# Patient Record
Sex: Male | Born: 1990 | Race: Black or African American | Hispanic: No | Marital: Single | Smoking: Current every day smoker
Health system: Southern US, Community
[De-identification: ages and names within clinical notes are randomized; demographics above are authoritative.]

---

## 2012-09-02 ENCOUNTER — Encounter (HOSPITAL_COMMUNITY): Payer: Self-pay | Admitting: Emergency Medicine

## 2012-09-02 ENCOUNTER — Emergency Department (HOSPITAL_COMMUNITY)
Admission: EM | Admit: 2012-09-02 | Discharge: 2012-09-03 | Disposition: A | Discharge status: Home / Self Care | Payer: Medicaid Other | Attending: Emergency Medicine | Admitting: Emergency Medicine

## 2012-09-02 DIAGNOSIS — N509 Disorder of male genital organs, unspecified: Secondary | ICD-10-CM | POA: Insufficient documentation

## 2012-09-02 DIAGNOSIS — R369 Urethral discharge, unspecified: Secondary | ICD-10-CM | POA: Insufficient documentation

## 2012-09-02 DIAGNOSIS — F17200 Nicotine dependence, unspecified, uncomplicated: Secondary | ICD-10-CM | POA: Insufficient documentation

## 2012-09-02 DIAGNOSIS — R300 Dysuria: Secondary | ICD-10-CM | POA: Insufficient documentation

## 2012-09-02 LAB — URINALYSIS, ROUTINE W REFLEX MICROSCOPIC
Bilirubin Urine: NEGATIVE
Glucose, UA: NEGATIVE mg/dL
Ketones, ur: NEGATIVE mg/dL
Nitrite: NEGATIVE
Protein, ur: 30 mg/dL — AB
Specific Gravity, Urine: 1.025 (ref 1.005–1.030)
Urobilinogen, UA: 1 mg/dL (ref 0.0–1.0)
pH: 6 (ref 5.0–8.0)

## 2012-09-02 LAB — URINE MICROSCOPIC-ADD ON

## 2012-09-02 MED ORDER — CEFTRIAXONE SODIUM 250 MG IJ SOLR
250.0000 mg | Freq: Once | INTRAMUSCULAR | Status: AC
  Administered 2012-09-03: 250 mg via INTRAMUSCULAR
  Filled 2012-09-02: qty 250

## 2012-09-02 MED ORDER — AZITHROMYCIN 250 MG PO TABS
1000.0000 mg | ORAL_TABLET | Freq: Once | ORAL | Status: AC
  Administered 2012-09-03: 1000 mg via ORAL
  Filled 2012-09-02: qty 4

## 2012-09-02 NOTE — ED Provider Notes (Signed)
History  This chart was scribed for non-physician practitioner, Felipa Furnace, working with Gavin Pound. Oletta Lamas, MD by Ardeen Jourdain, ED Scribe. This patient was seen in room TR07C/TR07C and the patient's care was started at 2312.  CSN: [id number]     Arrival date & time 09/02/12  2245   First MD Initiated Contact with Patient 09/02/12 2312     Chief Complaint  Patient presents with  . Penile Discharge    The history is provided by the patient. No language interpreter was used.    HPI Comments: Julian Reynolds is a 22 y.o. male who presents to the Emergency Department complaining of gradual onset, gradually worsening, constant penile discharge, testicular pain and dysuria that began 1 day ago. Pt states he had unprotected sex several days ago. He describes the discharge as yellow/green in color. He denies any fever, chills, nausea, emesis and penile swelling as associated symptoms.    History reviewed. No pertinent past medical history.  History reviewed. No pertinent past surgical history.  No family history on file.  History  Substance Use Topics  . Smoking status: Current Every Day Smoker  . Smokeless tobacco: Not on file  . Alcohol Use: Yes    Review of Systems  Genitourinary: Positive for dysuria and discharge.  All other systems reviewed and are negative.    Allergies  Review of patient's allergies indicates no known allergies.  Home Medications   Current Outpatient Rx  Name  Route  Sig  Dispense  Refill  . acetaminophen (TYLENOL) 325 MG tablet   Oral   Take 325-650 mg by mouth every 6 (six) hours as needed for pain (headache).           Triage Vitals: BP 148/92  Pulse 102  Temp(Src) 99 F (37.2 C) (Oral)  Resp 14  SpO2 98%  Physical Exam  Nursing note and vitals reviewed. Constitutional: He is oriented to person, place, and time. He appears well-developed and well-nourished. No distress.  HENT:  Head: Normocephalic and atraumatic.  Eyes: EOM  are normal.  Neck: Neck supple. No tracheal deviation present.  Cardiovascular: Normal rate.   Pulmonary/Chest: Effort normal. No respiratory distress.  Genitourinary:  Mild yellow discharge from penis. Circumcised male. No masses, bumps or lesions of the shaft or testes. No TTP  Musculoskeletal: Normal range of motion.  Neurological: He is alert and oriented to person, place, and time.  Skin: Skin is warm and dry.  Psychiatric: He has a normal mood and affect. His behavior is normal.    ED Course   Procedures (including critical care time)  DIAGNOSTIC STUDIES: Oxygen Saturation is 98% on room air, normal by my interpretation.    COORDINATION OF CARE:  11:34 PM-Discussed treatment plan which includes UA, urine microscopic, urine culture, GC/Chlymadia swab and antibiotics with pt at bedside and pt agreed to plan.    Labs Reviewed  URINALYSIS, ROUTINE W REFLEX MICROSCOPIC - Abnormal; Notable for the following:    APPearance CLOUDY (*)    Hgb urine dipstick SMALL (*)    Protein, ur 30 (*)    Leukocytes, UA LARGE (*)    All other components within normal limits  URINE MICROSCOPIC-ADD ON - Abnormal; Notable for the following:    Bacteria, UA MANY (*)    All other components within normal limits  URINE CULTURE   No results found. 1. Penile discharge     MDM  Patient with penile discharge. Will treat with azithromycin and ceftriaxone. Patient is stable  and ready for discharge.   I personally performed the services described in this documentation, which was scribed in my presence. The recorded information has been reviewed and is accurate.     Roxy Horseman, PA-C 09/02/12 2343

## 2012-09-02 NOTE — ED Notes (Signed)
PT. REPORTS PENILE DISCHARGE / DYSURIA ONSET YESTERDAY , PT. STATED UNPROTECTED SEXUAL ENCOUNTER SEVERAL DAYS AGO .

## 2012-09-03 NOTE — ED Notes (Signed)
No changes from triage assessment 

## 2012-09-04 LAB — URINE CULTURE: Colony Count: 25000

## 2012-09-04 NOTE — ED Provider Notes (Signed)
Medical screening examination/treatment/procedure(s) were performed by non-physician practitioner and as supervising physician I was immediately available for consultation/collaboration.   Gavin Pound. Oletta Lamas, MD 09/04/12 [phone]

## 2012-09-05 ENCOUNTER — Telehealth (HOSPITAL_COMMUNITY): Payer: Self-pay | Admitting: Emergency Medicine

## 2012-09-05 LAB — GC/CHLAMYDIA PROBE AMP
CT Probe RNA: NEGATIVE
GC Probe RNA: POSITIVE — AB

## 2012-09-05 NOTE — ED Notes (Signed)
+  Gonorrhea. Patient treated with Rocephin and Zithromax. [employer] faxed. Patient called for test results and was informed of +result and appropriate treatment. Was advised to abstain from sex for 2 weeks and notify partner(s) so that they could be treated.

## 2014-06-29 ENCOUNTER — Emergency Department (HOSPITAL_COMMUNITY)
Admission: EM | Admit: 2014-06-29 | Discharge: 2014-06-29 | Disposition: A | Discharge status: Home / Self Care | Payer: Medicaid Other | Attending: Emergency Medicine | Admitting: Emergency Medicine

## 2014-06-29 ENCOUNTER — Encounter (HOSPITAL_COMMUNITY): Payer: Self-pay | Admitting: Emergency Medicine

## 2014-06-29 DIAGNOSIS — J209 Acute bronchitis, unspecified: Secondary | ICD-10-CM

## 2014-06-29 DIAGNOSIS — R079 Chest pain, unspecified: Secondary | ICD-10-CM | POA: Diagnosis present

## 2014-06-29 DIAGNOSIS — Z720 Tobacco use: Secondary | ICD-10-CM | POA: Diagnosis not present

## 2014-06-29 MED ORDER — AZITHROMYCIN 250 MG PO TABS
500.0000 mg | ORAL_TABLET | Freq: Once | ORAL | Status: AC
  Administered 2014-06-29: 500 mg via ORAL
  Filled 2014-06-29: qty 2

## 2014-06-29 MED ORDER — ALBUTEROL SULFATE HFA 108 (90 BASE) MCG/ACT IN AERS
2.0000 | INHALATION_SPRAY | Freq: Once | RESPIRATORY_TRACT | Status: AC
  Administered 2014-06-29: 2 via RESPIRATORY_TRACT
  Filled 2014-06-29: qty 6.7

## 2014-06-29 MED ORDER — OXYCODONE-ACETAMINOPHEN 5-325 MG PO TABS
2.0000 | ORAL_TABLET | Freq: Once | ORAL | Status: AC
  Administered 2014-06-29: 2 via ORAL
  Filled 2014-06-29: qty 2

## 2014-06-29 MED ORDER — OXYCODONE-ACETAMINOPHEN 5-325 MG PO TABS: 1.0000 | ORAL_TABLET | ORAL | Status: DC | PRN

## 2014-06-29 MED ORDER — AZITHROMYCIN 250 MG PO TABS: ORAL_TABLET | ORAL | Status: DC

## 2014-06-29 MED ORDER — AEROCHAMBER Z-STAT PLUS/MEDIUM MISC
1.0000 | Freq: Once | Status: AC
  Administered 2014-06-29: 1
  Filled 2014-06-29: qty 1

## 2014-06-29 NOTE — ED Provider Notes (Signed)
CSN: [id number]     Arrival date & time 06/29/14  1306 History   First MD Initiated Contact with Patient 06/29/14 1341     Chief Complaint  Patient presents with  . Chest Pain     (Consider location/radiation/quality/duration/timing/severity/associated sxs/prior Treatment) HPI  Julian Reynolds is a 24 y.o. male who presents for evaluation of cough, chest discomfort, sputum production, ear pain and sore throat for 3 weeks. He smokes cigarettes. He denies weakness, dizziness, nausea or vomiting. There are no other known modifying factors.    History reviewed. No pertinent past medical history. History reviewed. No pertinent past surgical history. No family history on file. History  Substance Use Topics  . Smoking status: Current Every Day Smoker  . Smokeless tobacco: Not on file  . Alcohol Use: Yes    Review of Systems  All other systems reviewed and are negative.     Allergies  Review of patient's allergies indicates no known allergies.  Home Medications   Prior to Admission medications   Medication Sig Start Date End Date Taking? Authorizing Provider  acetaminophen (TYLENOL) 325 MG tablet Take 650 mg by mouth every 6 (six) hours as needed for moderate pain (headache).    Yes Historical Provider, MD   BP 141/96 mmHg  Pulse 82  Temp(Src) 98.3 F (36.8 C) (Oral)  Resp 20  SpO2 97% Physical Exam  Constitutional: He is oriented to person, place, and time. He appears well-developed and well-nourished.  HENT:  Head: Normocephalic and atraumatic.  Right Ear: External ear normal.  Left Ear: External ear normal.  Eyes: Conjunctivae and EOM are normal. Pupils are equal, round, and reactive to light.  Neck: Normal range of motion and phonation normal. Neck supple.  Cardiovascular: Normal rate, regular rhythm and normal heart sounds.   Pulmonary/Chest: Effort normal. He exhibits no bony tenderness.  Few scattered rhonchi. Mildly decreased airflow bilaterally. No audible  wheezes.  Abdominal: Soft. There is no tenderness.  Musculoskeletal: Normal range of motion.  Neurological: He is alert and oriented to person, place, and time. No cranial nerve deficit or sensory deficit. He exhibits normal muscle tone. Coordination normal.  Skin: Skin is warm, dry and intact.  Psychiatric: He has a normal mood and affect. His behavior is normal. Judgment and thought content normal.  Nursing note and vitals reviewed.   ED Course  Procedures (including critical care time) Medications  oxyCODONE-acetaminophen (PERCOCET/ROXICET) 5-325 MG per tablet 2 tablet (not administered)  azithromycin (ZITHROMAX) tablet 500 mg (not administered)  albuterol (PROVENTIL HFA;VENTOLIN HFA) 108 (90 BASE) MCG/ACT inhaler 2 puff (not administered)  aerochamber Z-Stat Plus/medium 1 each (not administered)    Patient Vitals for the past 24 hrs:  BP Temp Temp src Pulse Resp SpO2  06/29/14 1320 141/96 mmHg 98.3 F (36.8 C) Oral 82 20 97 %    3:45 PM Reevaluation with update and discussion. After initial assessment and treatment, an updated evaluation reveals he is more comfortable. [place]     Labs Review Labs Reviewed - No data to display  Imaging Review No results found.   EKG Interpretation   Date/Time:  Saturday Jun 29 2014 13:22:07 EDT Ventricular Rate:  76 PR Interval:  129 QRS Duration: 91 QT Interval:  356 QTC Calculation: 400 R Axis:   51 Text Interpretation:  Sinus rhythm Borderline ST elevation, anterior leads  No previous ECGs available Confirmed by Effie Shy  MD, Kairy Folsom ([id number]) on  06/29/2014 1:53:26 PM      MDM   Final  diagnoses:  Acute bronchitis, unspecified organism  Tobacco abuse    Evaluation consistent with bronchitis related to tobacco abuse. Doubt pneumonia, ACS or seizures bacterial infection  Nursing Notes Reviewed/ Care Coordinated Applicable Imaging Reviewed Interpretation of Laboratory Data incorporated into ED treatment  The  patient appears reasonably screened and/or stabilized for discharge and I doubt any other medical condition or other [facility] requiring further screening, evaluation, or treatment in the ED at this time prior to discharge.  Plan: Home Medications- oral ABX, Prednisone, inhaler; Home Treatments- stop smoking; return here if the recommended treatment, does not improve the symptoms; Recommended follow up- PCP prn    Mancel BaleElliott Brendin Situ, MD 07/02/14 1531

## 2014-06-29 NOTE — Discharge Instructions (Signed)
Use the inhaler 2 puffs every 4 hours for cough or trouble breathing. Get plenty of rest, and drink a lot of fluids.    Acute Bronchitis Bronchitis is inflammation of the airways that extend from the windpipe into the lungs (bronchi). The inflammation often causes mucus to develop. This leads to a cough, which is the most common symptom of bronchitis.  In acute bronchitis, the condition usually develops suddenly and goes away over time, usually in a couple weeks. Smoking, allergies, and asthma can make bronchitis worse. Repeated episodes of bronchitis may cause further lung problems.  CAUSES Acute bronchitis is most often caused by the same virus that causes a cold. The virus can spread from person to person (contagious) through coughing, sneezing, and touching contaminated objects. SIGNS AND SYMPTOMS   Cough.   Fever.   Coughing up mucus.   Body aches.   Chest congestion.   Chills.   Shortness of breath.   Sore throat.  DIAGNOSIS  Acute bronchitis is usually diagnosed through a physical exam. Your health care provider will also ask you questions about your medical history. Tests, such as chest X-rays, are sometimes done to rule out other conditions.  TREATMENT  Acute bronchitis usually goes away in a couple weeks. Oftentimes, no medical treatment is necessary. Medicines are sometimes given for relief of fever or cough. Antibiotic medicines are usually not needed but may be prescribed in certain situations. In some cases, an inhaler may be recommended to help reduce shortness of breath and control the cough. A cool mist vaporizer may also be used to help thin bronchial secretions and make it easier to clear the chest.  HOME CARE INSTRUCTIONS  Get plenty of rest.   Drink enough fluids to keep your urine clear or pale yellow (unless you have a medical condition that requires fluid restriction). Increasing fluids may help thin your respiratory secretions (sputum) and reduce  chest congestion, and it will prevent dehydration.   Take medicines only as directed by your health care provider.  If you were prescribed an antibiotic medicine, finish it all even if you start to feel better.  Avoid smoking and secondhand smoke. Exposure to cigarette smoke or irritating chemicals will make bronchitis worse. If you are a smoker, consider using nicotine gum or skin patches to help control withdrawal symptoms. Quitting smoking will help your lungs heal faster.   Reduce the chances of another bout of acute bronchitis by washing your hands frequently, avoiding people with cold symptoms, and trying not to touch your hands to your mouth, nose, or eyes.   Keep all follow-up visits as directed by your health care provider.  SEEK MEDICAL CARE IF: Your symptoms do not improve after 1 week of treatment.  SEEK IMMEDIATE MEDICAL CARE IF:  You develop an increased fever or chills.   You have chest pain.   You have severe shortness of breath.  You have bloody sputum.   You develop dehydration.  You faint or repeatedly feel like you are going to pass out.  You develop repeated vomiting.  You develop a severe headache. MAKE SURE YOU:   Understand these instructions.  Will watch your condition.  Will get help right away if you are not doing well or get worse. Document Released: 03/04/2004 Document Revised: 06/11/2013 Document Reviewed: 07/18/2012 [facility] Patient Information 2015 [employer] [place]. This information is not intended to replace advice given to you by your health care provider. Make sure you discuss any questions you have with your health  care provider.  

## 2014-06-29 NOTE — ED Notes (Signed)
Patient with productive cough and chest pain for three weeks.  Pain is in the upper chest area with no radiation.  He also reports bilateral ear pain and a sore throat.  Patient is unsure of any prior medical history and reports he has not seen a doctor in a long time.

## 2014-12-11 ENCOUNTER — Emergency Department (HOSPITAL_COMMUNITY): Payer: Medicaid Other

## 2014-12-11 ENCOUNTER — Emergency Department (HOSPITAL_COMMUNITY)
Admission: EM | Admit: 2014-12-11 | Discharge: 2014-12-11 | Disposition: A | Discharge status: Home / Self Care | Payer: Medicaid Other | Source: Home / Self Care | Attending: Emergency Medicine | Admitting: Emergency Medicine

## 2014-12-11 ENCOUNTER — Encounter (HOSPITAL_COMMUNITY): Payer: Self-pay | Admitting: Emergency Medicine

## 2014-12-11 DIAGNOSIS — Z202 Contact with and (suspected) exposure to infections with a predominantly sexual mode of transmission: Secondary | ICD-10-CM

## 2014-12-11 DIAGNOSIS — B349 Viral infection, unspecified: Secondary | ICD-10-CM

## 2014-12-11 LAB — COMPREHENSIVE METABOLIC PANEL
ALT: 98 U/L — ABNORMAL HIGH (ref 17–63)
AST: 77 U/L — AB (ref 15–41)
Albumin: 4.5 g/dL (ref 3.5–5.0)
Alkaline Phosphatase: 50 U/L (ref 38–126)
Anion gap: 8 (ref 5–15)
BILIRUBIN TOTAL: 0.9 mg/dL (ref 0.3–1.2)
BUN: 8 mg/dL (ref 6–20)
CO2: 22 mmol/L (ref 22–32)
Calcium: 9.5 mg/dL (ref 8.9–10.3)
Chloride: 106 mmol/L (ref 101–111)
Creatinine, Ser: 1.34 mg/dL — ABNORMAL HIGH (ref 0.61–1.24)
GFR calc Af Amer: >60 mL/min (ref >60)
Glucose, Bld: 116 mg/dL — ABNORMAL HIGH (ref 65–99)
POTASSIUM: 4.4 mmol/L (ref 3.5–5.1)
Sodium: 136 mmol/L (ref 135–145)
TOTAL PROTEIN: 8.2 g/dL — AB (ref 6.5–8.1)

## 2014-12-11 LAB — CBC WITH DIFFERENTIAL/PLATELET
BASOS ABS: 0 10*3/uL (ref 0.0–0.1)
BASOS PCT: 0 %
EOS PCT: 3 %
Eosinophils Absolute: 0.3 10*3/uL (ref 0.0–0.7)
HCT: 47.5 % (ref 39.0–52.0)
Hemoglobin: 16.4 g/dL (ref 13.0–17.0)
Lymphocytes Relative: 15 %
Lymphs Abs: 1.6 10*3/uL (ref 0.7–4.0)
MCH: 29.2 pg (ref 26.0–34.0)
MCHC: 34.5 g/dL (ref 30.0–36.0)
MCV: 84.5 fL (ref 78.0–100.0)
MONO ABS: 0.9 10*3/uL (ref 0.1–1.0)
Monocytes Relative: 8 %
NEUTROS ABS: 7.9 10*3/uL — AB (ref 1.7–7.7)
Neutrophils Relative %: 74 %
PLATELETS: 351 10*3/uL (ref 150–400)
RBC: 5.62 MIL/uL (ref 4.22–5.81)
RDW: 12.4 % (ref 11.5–15.5)
WBC: 10.7 10*3/uL — ABNORMAL HIGH (ref 4.0–10.5)

## 2014-12-11 LAB — URINALYSIS, ROUTINE W REFLEX MICROSCOPIC
BILIRUBIN URINE: NEGATIVE
Glucose, UA: NEGATIVE mg/dL
KETONES UR: NEGATIVE mg/dL
NITRITE: NEGATIVE
Protein, ur: NEGATIVE mg/dL
Specific Gravity, Urine: 1.026 (ref 1.005–1.030)
UROBILINOGEN UA: 1 mg/dL (ref 0.0–1.0)
pH: 5.5 (ref 5.0–8.0)

## 2014-12-11 LAB — URINE MICROSCOPIC-ADD ON

## 2014-12-11 LAB — RAPID HIV SCREEN (HIV 1/2 AB+AG)
HIV 1/2 ANTIBODIES: NONREACTIVE
HIV-1 P24 ANTIGEN - HIV24: NONREACTIVE

## 2014-12-11 LAB — LIPASE, BLOOD: LIPASE: 23 U/L (ref 11–51)

## 2014-12-11 LAB — RAPID STREP SCREEN (MED CTR MEBANE ONLY): Streptococcus, Group A Screen (Direct): NEGATIVE

## 2014-12-11 MED ORDER — KETOROLAC TROMETHAMINE 30 MG/ML IJ SOLN
30.0000 mg | Freq: Once | INTRAMUSCULAR | Status: AC
Start: 2014-12-11 — End: 2014-12-11
  Administered 2014-12-11: 30 mg via INTRAVENOUS
  Filled 2014-12-11: qty 1

## 2014-12-11 MED ORDER — PROMETHAZINE HCL 25 MG PO TABS: 25.0000 mg | ORAL_TABLET | Freq: Four times a day (QID) | ORAL | Status: AC | PRN

## 2014-12-11 MED ORDER — AZITHROMYCIN 1 G PO PACK
1.0000 g | PACK | Freq: Once | ORAL | Status: AC
  Administered 2014-12-11: 1 g via ORAL
  Filled 2014-12-11: qty 1

## 2014-12-11 MED ORDER — IBUPROFEN 800 MG PO TABS: 800.0000 mg | ORAL_TABLET | Freq: Three times a day (TID) | ORAL | Status: DC

## 2014-12-11 MED ORDER — ACETAMINOPHEN 500 MG PO TABS: 1000.0000 mg | ORAL_TABLET | Freq: Four times a day (QID) | ORAL | Status: DC | PRN

## 2014-12-11 MED ORDER — ONDANSETRON 4 MG PO TBDP
4.0000 mg | ORAL_TABLET | Freq: Once | ORAL | Status: AC
  Administered 2014-12-11: 4 mg via ORAL
  Filled 2014-12-11: qty 1

## 2014-12-11 MED ORDER — SODIUM CHLORIDE 0.9 % IV BOLUS (SEPSIS)
1000.0000 mL | Freq: Once | INTRAVENOUS | Status: AC
  Administered 2014-12-11: 1000 mL via INTRAVENOUS

## 2014-12-11 MED ORDER — ACETAMINOPHEN 500 MG PO TABS
1000.0000 mg | ORAL_TABLET | Freq: Once | ORAL | Status: AC
  Administered 2014-12-11: 1000 mg via ORAL
  Filled 2014-12-11: qty 2

## 2014-12-11 MED ORDER — DEXTROSE 5 % IV SOLN
1.0000 g | Freq: Once | INTRAVENOUS | Status: AC
  Administered 2014-12-11: 1 g via INTRAVENOUS
  Filled 2014-12-11: qty 10

## 2014-12-11 NOTE — ED Provider Notes (Signed)
CSN: [id number]     Arrival date & time 12/11/14  4098 History   First MD Initiated Contact with Patient 12/11/14 [phone]     Chief Complaint  Patient presents with  . URI     (Consider location/radiation/quality/duration/timing/severity/associated sxs/prior Treatment) HPI Patient states that he has been sick for about 3 days now. He reports he hasn't been able to get out of bed. He reports he's had a harsh cough. He reports he has a severe sore throat and it's painful to swallow. He reports his eyes are red and have had drainage. He reports he has had vomiting and diarrhea. He has not been measuring his temperature but he does report chills. Patient has not had specific rash however he has noted a lesion on his scrotum. He states he notes some burning when he pees. He does report he has a history of gonorrhea. Patient has no travel history. History reviewed. No pertinent past medical history. History reviewed. No pertinent past surgical history. No family history on file. Social History  Substance Use Topics  . Smoking status: Current Every Day Smoker  . Smokeless tobacco: None  . Alcohol Use: Yes    Review of Systems 10 Systems reviewed and are negative for acute change except as noted in the HPI.    Allergies  Review of patient's allergies indicates no known allergies.  Home Medications   Prior to Admission medications   Medication Sig Start Date End Date Taking? Authorizing Provider  albuterol (PROVENTIL HFA;VENTOLIN HFA) 108 (90 BASE) MCG/ACT inhaler Inhale 1 puff into the lungs every 6 (six) hours as needed for wheezing or shortness of breath.   Yes Historical Provider, MD  DM-Doxylamine-Acetaminophen (NYQUIL COLD & FLU PO) Take 2 tablets by mouth at bedtime as needed (for cold and flu symptoms).   Yes Historical Provider, MD  acetaminophen (TYLENOL) 500 MG tablet Take 2 tablets (1,000 mg total) by mouth every 6 (six) hours as needed. 12/11/14   Arby Barrette, MD  azithromycin  (ZITHROMAX Z-PAK) 250 MG tablet 2 po day one, then 1 daily x 4 days Patient not taking: Reported on 12/11/2014 06/29/14   Mancel Bale, MD  ibuprofen (ADVIL,MOTRIN) 800 MG tablet Take 1 tablet (800 mg total) by mouth 3 (three) times daily. 12/11/14   Arby Barrette, MD  oxyCODONE-acetaminophen (PERCOCET) 5-325 MG per tablet Take 1 tablet by mouth every 4 (four) hours as needed. Patient not taking: Reported on 12/11/2014 06/29/14   Mancel Bale, MD  promethazine (PHENERGAN) 25 MG tablet Take 1 tablet (25 mg total) by mouth every 6 (six) hours as needed for nausea or vomiting. 12/11/14   Arby Barrette, MD   BP 124/80 mmHg  Pulse 99  Temp(Src) 98.9 F (37.2 C) (Oral)  Resp 18  Wt 224 lb (101.606 kg)  SpO2 100% Physical Exam  Constitutional: He is oriented to person, place, and time.  Patient appears ill and uncomfortable. He is alert and nontoxic. He does not have respiratory distress.  HENT:  Head: Normocephalic and atraumatic.  Right Ear: External ear normal.  Left Ear: External ear normal.  Bilateral TMs are normal. Oropharynx has erosive, ulcerative lesions on the soft palate and uvula. Posterior oropharynx is widely patent.  Eyes: EOM are normal. Pupils are equal, round, and reactive to light.  The patient has diffuse, bilateral scleral injection is a small amount of crusting in the lashes. No paravertebral edema.  Neck: Neck supple. No thyromegaly present.  Cardiovascular: Normal rate, regular rhythm, normal heart sounds  and intact distal pulses.   Pulmonary/Chest: Effort normal and breath sounds normal. No respiratory distress. He has no wheezes. He has no rales.  Patient has occasional harsh cough. He has no respiratory distress.  Abdominal: Soft. Bowel sounds are normal. He exhibits no distension. There is no tenderness.  Genitourinary:  Scrotum has a small ulcerative type lesion. See the photo documentation. Penis does not have any ulcerations or lesions or urethral discharge.    Musculoskeletal: Normal range of motion. He exhibits no edema or tenderness.  Neurological: He is alert and oriented to person, place, and time. No cranial nerve deficit. He exhibits normal muscle tone. Coordination normal.  Skin: Skin is warm and dry. No rash noted.  Psychiatric: He has a normal mood and affect.        ED Course  Procedures (including critical care time) Labs Review Labs Reviewed  COMPREHENSIVE METABOLIC PANEL - Abnormal; Notable for the following:    Glucose, Bld 116 (*)    Creatinine, Ser 1.34 (*)    Total Protein 8.2 (*)    AST 77 (*)    ALT 98 (*)    All other components within normal limits  CBC WITH DIFFERENTIAL/PLATELET - Abnormal; Notable for the following:    WBC 10.7 (*)    Neutro Abs 7.9 (*)    All other components within normal limits  URINALYSIS, ROUTINE W REFLEX MICROSCOPIC (NOT AT [facility]) - Abnormal; Notable for the following:    Hgb urine dipstick SMALL (*)    Leukocytes, UA SMALL (*)    All other components within normal limits  RAPID STREP SCREEN (NOT AT [facility])  CULTURE, BLOOD (ROUTINE X 2)  CULTURE, BLOOD (ROUTINE X 2)  CULTURE, GROUP A STREP  LIPASE, BLOOD  RAPID HIV SCREEN (HIV 1/2 AB+AG)  URINE MICROSCOPIC-ADD ON  HSV(HERPES SMPLX)ABS-I+II(IGG+IGM)-BLD  GC/CHLAMYDIA PROBE AMP () NOT AT [facility]    Imaging Review Dg Chest 2 View  12/11/2014  CLINICAL DATA:  Cough, congestion, shortness of breath for 6 days, smoker EXAM: CHEST  2 VIEW COMPARISON:  None. FINDINGS: Cardiomediastinal silhouette is unremarkable. No acute infiltrate or pleural effusion. No pulmonary edema. Bony thorax is unremarkable. IMPRESSION: No active cardiopulmonary disease. Electronically Signed   By: Natasha MeadLiviu  Pop M.D.   On: 12/11/2014 11:30   I have personally reviewed and evaluated these images and lab results as part of my medical decision-making.   EKG Interpretation None      MDM   Final diagnoses:  Viral syndrome  Possible exposure to STD    Patient's symptoms are consistent with a viral syndrome. He does have ulcerative type lesions on the posterior oropharynx and multisystem symptoms. Patient did note that he had a small lesion on his scrotum with a positive history of gonorrhea. HIV, HSV and Chlamydia gonorrhea testing obtained. At this point HIV is negative. For the patient's history and what is suspected to be an incidental genital lesion, the patient was treated with Rocephin and Zithromax. I more highly suspect that the pharyngitis, conjunctiva is, cough and GI symptoms are due to an influenza-like viral illness. The patient's vital signs are stable and he is nontoxic. The patient is advised to use ibuprofen and Tylenol with increased fluid intake for symptomatic treatment. He is to return for recheck if symptoms are worsening or changing in any way.    Arby BarretteMarcy Deshauna Cayson, MD 12/11/14 [phone]

## 2014-12-11 NOTE — ED Notes (Signed)
Pt c/o cold symptoms.  Pt states "everything hurts, my eyes, head, throat".  Pt states that he had symptoms and they went away and now they are back, so pt uncertain of how long he has been having these symptoms.

## 2014-12-11 NOTE — ED Notes (Signed)
Bed: WA03 Expected date:  Expected time:  Means of arrival:  Comments: 

## 2014-12-11 NOTE — Discharge Instructions (Signed)
Viral illness/Influenza like illness, Adult Influenza ("the flu") is a viral infection of the respiratory tract. It occurs more often in winter months because people spend more time in close contact with one another. Influenza can make you feel very sick. Influenza easily spreads from person to person (contagious). CAUSES  Influenza is caused by a virus that infects the respiratory tract. You can catch the virus by breathing in droplets from an infected person's cough or sneeze. You can also catch the virus by touching something that was recently contaminated with the virus and then touching your mouth, nose, or eyes. RISKS AND COMPLICATIONS You may be at risk for a more severe case of influenza if you smoke cigarettes, have diabetes, have chronic heart disease (such as heart failure) or lung disease (such as asthma), or if you have a weakened immune system. Elderly people and pregnant women are also at risk for more serious infections. The most common problem of influenza is a lung infection (pneumonia). Sometimes, this problem can require emergency medical care and may be life threatening. SIGNS AND SYMPTOMS  Symptoms typically last 4 to 10 days and may include:  Fever.  Chills.  Headache, body aches, and muscle aches.  Sore throat.  Chest discomfort and cough.  Poor appetite.  Weakness or feeling tired.  Dizziness.  Nausea or vomiting. DIAGNOSIS  Diagnosis of influenza is often made based on your history and a physical exam. A nose or throat swab test can be done to confirm the diagnosis. TREATMENT  In mild cases, influenza goes away on its own. Treatment is directed at relieving symptoms. For more severe cases, your health care provider may prescribe antiviral medicines to shorten the sickness. Antibiotic medicines are not effective because the infection is caused by a virus, not by bacteria. HOME CARE INSTRUCTIONS  Take medicines only as directed by your health care  provider.  Use a cool mist humidifier to make breathing easier.  Get plenty of rest until your temperature returns to normal. This usually takes 3 to 4 days.  Drink enough fluid to keep your urine clear or pale yellow.  Cover yourmouth and nosewhen coughing or sneezing,and wash your handswellto prevent thevirusfrom spreading.  Stay homefromwork orschool untilthe fever is gonefor at least 531full day. PREVENTION  An annual influenza vaccination (flu shot) is the best way to avoid getting influenza. An annual flu shot is now routinely recommended for all adults in the [place] SEEK MEDICAL CARE IF:  You experiencechest pain, yourcough worsens,or you producemore mucus.  Youhave nausea,vomiting, ordiarrhea.  Your fever returns or gets worse. SEEK IMMEDIATE MEDICAL CARE IF:  You havetrouble breathing, you become short of breath,or your skin ornails becomebluish.  You have severe painor stiffnessin the neck.  You develop a sudden headache, or pain in the face or ear.  You have nausea or vomiting that you cannot control. MAKE SURE YOU:   Understand these instructions.  Will watch your condition.  Will get help right away if you are not doing well or get worse.   This information is not intended to replace advice given to you by your health care provider. Make sure you discuss any questions you have with your health care provider.   Document Released: 01/23/2000 Document Revised: 02/15/2014 Document Reviewed: 04/26/2011 Elsevier Interactive Patient Education [employer]2016 Elsevier Inc. Information for Sexually Transmitted Disease A sexually transmitted disease (STD) is a disease or infection that may be passed (transmitted) from person to person, usually during sexual activity. This may happen by  way of saliva, semen, blood, vaginal mucus, or urine. Common STDs include:  Gonorrhea.  Chlamydia.  Syphilis.  HIV and AIDS.  Genital herpes.  Hepatitis B and  C.  Trichomonas.  Human papillomavirus (HPV).  Pubic lice.  Scabies.  Mites.  Bacterial vaginosis. WHAT ARE CAUSES OF STDs? An STD may be caused by bacteria, a virus, or parasites. STDs are often transmitted during sexual activity if one person is infected. However, they may also be transmitted through nonsexual means. STDs may be transmitted after:   Sexual intercourse with an infected person.  Sharing sex toys with an infected person.  Sharing needles with an infected person or using unclean piercing or tattoo needles.  Having intimate contact with the genitals, mouth, or rectal areas of an infected person.  Exposure to infected fluids during birth. WHAT ARE THE SIGNS AND SYMPTOMS OF STDs? Different STDs have different symptoms. Some people may not have any symptoms. If symptoms are present, they may include:  Painful or bloody urination.  Pain in the pelvis, abdomen, vagina, anus, throat, or eyes.  A skin rash, itching, or irritation.  Growths, ulcerations, blisters, or sores in the genital and anal areas.  Abnormal vaginal discharge with or without bad odor.  Penile discharge in men.  Fever.  Pain or bleeding during sexual intercourse.  Swollen glands in the groin area.  Yellow skin and eyes (jaundice). This is seen with hepatitis.  Swollen testicles.  Infertility.  Sores and blisters in the mouth. HOW ARE STDs DIAGNOSED? To make a diagnosis, your health care provider may:  Take a medical history.  Perform a physical exam.  Take a sample of any discharge to examine.  Swab the throat, cervix, opening to the penis, rectum, or vagina for testing.  Test a sample of your first morning urine.  Perform blood tests.  Perform a Pap test, if this applies.  Perform a colposcopy.  Perform a laparoscopy. HOW ARE STDs TREATED? Treatment depends on the STD. Some STDs may be treated but not cured.  Chlamydia, gonorrhea, trichomonas, and syphilis can be  cured with antibiotic medicine.  Genital herpes, hepatitis, and HIV can be treated, but not cured, with prescribed medicines. The medicines lessen symptoms.  Genital warts from HPV can be treated with medicine or by freezing, burning (electrocautery), or surgery. Warts may come back.  HPV cannot be cured with medicine or surgery. However, abnormal areas may be removed from the cervix, vagina, or vulva.  If your diagnosis is confirmed, your recent sexual partners need treatment. This is true even if they are symptom-free or have a negative culture or evaluation. They should not have sex until their health care providers say it is okay.  Your health care provider may test you for infection again 3 months after treatment. HOW CAN I REDUCE MY RISK OF GETTING AN STD? Take these steps to reduce your risk of getting an STD:  Use latex condoms, dental dams, and water-soluble lubricants during sexual activity. Do not use petroleum jelly or oils.  Avoid having multiple sex partners.  Do not have sex with someone who has other sex partners  Do not have sex with anyone you do not know or who is at high risk for an STD.  Avoid risky sex practices that can break your skin.  Do not have sex if you have open sores on your mouth or skin.  Avoid drinking too much alcohol or taking illegal drugs. Alcohol and drugs can affect your judgment and put  you in a vulnerable position.  Avoid engaging in oral and anal sex acts.  Get vaccinated for HPV and hepatitis. If you have not received these vaccines in the past, talk to your health care provider about whether one or both might be right for you.  If you are at risk of being infected with HIV, it is recommended that you take a prescription medicine daily to prevent HIV infection. This is called pre-exposure prophylaxis (PrEP). You are considered at risk if:  You are a man who has sex with other men (MSM).  You are a heterosexual man or woman and are  sexually active with more than one partner.  You take drugs by injection.  You are sexually active with a partner who has HIV.  Talk with your health care provider about whether you are at high risk of being infected with HIV. If you choose to begin PrEP, you should first be tested for HIV. You should then be tested every 3 months for as long as you are taking PrEP. WHAT SHOULD I DO IF I THINK I HAVE AN STD?  See your health care provider.  Tell your sexual partner(s). They should be tested and treated for any STDs.  Do not have sex until your health care provider says it is okay. WHEN SHOULD I GET IMMEDIATE MEDICAL CARE? Contact your health care provider right away if:   You have severe abdominal pain.  You are a man and notice swelling or pain in your testicles.  You are a woman and notice swelling or pain in your vagina.   This information is not intended to replace advice given to you by your health care provider. Make sure you discuss any questions you have with your health care provider.   Document Released: 04/17/2002 Document Revised: 02/15/2014 Document Reviewed: 08/15/2012 Elsevier Interactive Patient Education 2016 [employer].   Emergency Department Resource Guide 1) Find a Doctor and Pay Out of Pocket Although you won't have to find out who is covered by your insurance plan, it is a good idea to ask around and get recommendations. You will then need to call the office and see if the doctor you have chosen will accept you as a new patient and what types of options they offer for patients who are self-pay. Some doctors offer discounts or will set up payment plans for their patients who do not have insurance, but you will need to ask so you aren't surprised when you get to your appointment.  2) Contact Your Local Health Department Not all health departments have doctors that can see patients for sick visits, but many do, so it is worth a call to see if yours does. If you  don't know where your local health department is, you can check in your phone book. The CDC also has a tool to help you locate your state's health department, and many state websites also have listings of all of their local health departments.  3) Find a Walk-in Clinic If your illness is not likely to be very severe or complicated, you may want to try a walk in clinic. These are popping up all over the country in pharmacies, drugstores, and shopping centers. They're usually staffed by nurse practitioners or physician assistants that have been trained to treat common illnesses and complaints. They're usually fairly quick and inexpensive. However, if you have serious medical issues or chronic medical problems, these are probably not your best option.  No Primary Care Doctor: -  Call Health Connect at  [phone] - they can help you locate a primary care doctor that  accepts your insurance, provides certain services, etc. - Physician Referral Service- [phone]  Chronic Pain Problems: Organization         Address  Phone   Notes  [facility]  [phone] Patients need to be referred by their primary care doctor.   Medication Assistance: Organization         Address  Phone   Notes  [facility] Medication [facility] [address] [place] [place], [place] [address] [phone] --Must be a resident of [facility] -- Must have NO insurance coverage whatsoever (no Medicaid/ Medicare, etc.) -- The pt. MUST have a primary care doctor that directs their care regularly and follows them in the community   Med[facility]ist  [phone]   [employer]  [phone]    Agencies that provide inexpensive medical care: Organization         Address  Phone   Notes  [facility]  [phone]   [facility]    [phone]   [facility] [address] [place], [place] [address] [phone]   [facility]  [place] [address], [place] [phone]   Planned Parenthood    [phone]   [facility]    [phone]   [facility]  [address], [place] Phone:  [phone], Fax:  [phone] Hours of Operation:  9 am - 6 pm, M-F.  Also accepts Medicaid/Medicare and self-pay.  [facility]  [address], [place] Phone: [phone], Fax: [phone]. Hours of Operation:  8:30 am - 5:30 pm, M-F.  Also accepts Medicaid and self-pay.  [facility] [place] [address] [place] Phone: [phone]   [facility] [address] Natasha Bence [place], [place] [phone], Ext. [phone] Mondays & Thursdays: 7-9 AM.  First 15 patients are seen on a first come, first serve basis.    Medicaid-accepting [facility] Providers:  Organization         Address  Phone   Notes  [facility] [address], [place] [phone] Also accepts self-pay patients.  [facility] [address] Laurell Josephs [place], [place]  [phone]   [facility] [address], [place] [phone]   [facility] [address] [place] [phone]   Renaye Rakers [address], [place]   [phone] Only accepts [place] Access [place] patients after they have their name applied to their card.   Self-Pay (no insurance) in [facility]:  Organization         Address  Phone   Notes  Sickle Cell Patients, [facility] [address] [place], [place] [phone]   [facility]gent Care [address] [place], [place] [phone]   [facility] [place]  [address], [place] [phone]   [facility]/Dr. Osei-Bonsu  [address], [place] or [address], [place] [phone] Phone  number for both [place] and [place] locations is the same.  Urgent Medical and [facility] [address], [place] [phone]   [facility] [place]  [address], [place] or [address] [phone]   [facility] [address] [place], [place] [phone], phone; [phone], fax Sees patients 1st and 3rd Saturday of every month.  Must not qualify for public or private insurance (i.e. Medicaid, Medicare, [place] Health Choice, Veterans' Benefits)  Household income should be no more than 200% of the poverty level The clinic cannot treat you if you are pregnant or think you are pregnant  Sexually transmitted diseases are not treated at the clinic.    Dental Care: Organization         Address  Phone  Notes  [facility] Department of [facility] [address] [place], [place] [phone] Accepts children up to age 59 who are enrolled in [place] or [place] Health Choice; pregnant women with a Medicaid card; and children who have applied for Medicaid or [place] Health Choice, but were declined, whose parents can pay a reduced fee at time of service.  [facility] of [facility]  [address], [place] [phone] Accepts children up to age 27 who are enrolled in [place] or [place] Health Choice; pregnant women with a Medicaid card; and children who have applied for Medicaid or [place] Health Choice, but were declined, whose parents can pay a reduced fee at time of service.  [facility]  [address] [place], [place] [phone] Patients are seen by appointment only. Walk-ins are not accepted. [facility] will see patients 19 years of age and older. Monday - Tuesday (8am-5pm) Most Wednesdays (8:30-5pm) $30 per visit, cash only  [facility] Adult Dental Access PROGRAM  [address], [facility] [phone] Patients are seen by appointment only.  Walk-ins are not accepted. [facility] will see patients 38 years of age and older. One Wednesday Evening (Monthly: Volunteer Based).  $30 per visit, cash only  [employer]  [phone] for adults; Children under age 15, call Graduate Pediatric Dentistry at [phone]. Children aged 20-14, please call [phone] to request a pediatric application.  Dental services are provided in all areas of dental care including fillings, crowns and bridges, complete and partial dentures, implants, gum treatment, root canals, and extractions. Preventive care is also provided. Treatment is provided to both adults and children. Patients are selected via a lottery and there is often a waiting list.   [facility] [address], [place]  [phone] [email or web address]   [facility] [address] [place], [place] [phone], Ext. [phone] Second and Fourth Thursday of each month, opens at 6:30 AM; Clinic ends at 9 AM.  Patients are seen on a first-come first-served basis, and a limited number are seen during each clinic.   [facility]  [address] Ether Griffins [place], [place] [phone]   Eligibility Requirements You must have lived in [place], [place], or [place] counties for at least the last three months.   You cannot be eligible for state or federal sponsored [place], including [employer], [place], or [employer].   You generally cannot be eligible for healthcare insurance through your employer.    How to apply: Eligibility screenings are held every Tuesday and Wednesday afternoon from 1:00 pm until 4:00 pm. You do not need an appointment for the interview!  [facility] [address] [place], [place] [phone]   [facility] Department  [phone]   [facility] Health Department  [phone]   [facility] Health Department  [phone]    Behavioral Health  Resources in the Community: Intensive Outpatient Programs Organization         Address  Phone  Notes  [facility] [address], [place], [place] [phone]   [facility] Outpatient [address] [place], [place] [phone]   [facility] [address], [place], [place]  [phone]   [facility] Mental Health [address]  [place], [place] [phone] or [phone]   Substance Abuse Resources Organization         Address  Phone  Notes  [facility] Drug Services  [phone]   [facility]  [phone]   [facility] [place]  [phone]   Floydene Flock  [phone]   Residential & Outpatient Substance Abuse Program  [phone]   Psychological Services Organization         Address  Phone  Notes  [facility] Behavioral Health  [phone]   [facility] Services  [phone]   [facility] Mental Health [address] [place] [address] [place] [phone] or [phone]    Mobile Crisis Teams Organization         Address  Phone  Notes  [facility], Mobile Crisis Care Unit  [phone]   [facility]  [address] [place], [place] [phone]   Doristine Locks [address] [place] [phone]    Self-Help/Support Groups Organization         Address  Phone             Notes  [facility] [place] - variety of support groups  336- I7437963 Call for more information  Narcotics Anonymous (NA), [facility] [address], [employer] [place]  2 meetings at this location   Statistician         Address  Phone  Notes  [facility] [address],    [place]  [phone]   [facility]  [address] [place] [address], [place], [place] [phone]   [facility] [address] [place], [place] [phone] Admissions: 8am-3pm M-F  [facility] [address]    [place], [place] [phone]   [facility] [address] [place], [place], [place] [phone]   [facility] [address],  [place], [place] [phone]   [facility] - Intensive Outpatient [address] Laurell Josephs [address], [place], [place] [phone]   [facility] ([facility]iction [facility] [address] [place]  [place], [place] [phone] or [phone]   [facility] ([facility]) [address] [place], [place] [phone] Accepts Medicaid  [facility] [place] [address] [place] [address]  [place] [phone] Substance Abuse/Addiction Treatment   [facility] Organization         Address  Phone  Notes  [facility]  [phone]   Angie Fava, PhD [address] Ervin Knack [place], [place]   [phone] or [phone]   [facility] Behavioral   [address] [place], [place] [phone]   [facility] [address] [place], [place] [phone] Insurance/Medicaid/sponsorship through [employer] and Families [address]  [place], [place] [phone] [place], [place] [phone]    Dr. Adele Schilder  [phone]   [facility] Dept. 1) [address], [place] 2) [place] 3)  [place] [address], [place] [phone]  [phone]   [place] [phone] or [phone] (After Hours)

## 2014-12-12 ENCOUNTER — Inpatient Hospital Stay (HOSPITAL_COMMUNITY)
Admission: EM | Admit: 2014-12-12 | Discharge: 2014-12-16 | DRG: 546 | Disposition: A | Discharge status: Home / Self Care | Payer: Medicaid Other | Attending: Internal Medicine | Admitting: Internal Medicine

## 2014-12-12 ENCOUNTER — Emergency Department (HOSPITAL_COMMUNITY): Payer: Medicaid Other

## 2014-12-12 ENCOUNTER — Encounter (HOSPITAL_COMMUNITY): Payer: Self-pay | Admitting: Emergency Medicine

## 2014-12-12 DIAGNOSIS — A5401 Gonococcal cystitis and urethritis, unspecified: Secondary | ICD-10-CM | POA: Diagnosis present

## 2014-12-12 DIAGNOSIS — Z7253 High risk bisexual behavior: Secondary | ICD-10-CM

## 2014-12-12 DIAGNOSIS — N485 Ulcer of penis: Secondary | ICD-10-CM | POA: Diagnosis present

## 2014-12-12 DIAGNOSIS — R945 Abnormal results of liver function studies: Secondary | ICD-10-CM | POA: Diagnosis present

## 2014-12-12 DIAGNOSIS — H109 Unspecified conjunctivitis: Secondary | ICD-10-CM | POA: Diagnosis present

## 2014-12-12 DIAGNOSIS — R05 Cough: Secondary | ICD-10-CM | POA: Diagnosis not present

## 2014-12-12 DIAGNOSIS — N489 Disorder of penis, unspecified: Secondary | ICD-10-CM | POA: Insufficient documentation

## 2014-12-12 DIAGNOSIS — K1379 Other lesions of oral mucosa: Secondary | ICD-10-CM | POA: Diagnosis not present

## 2014-12-12 DIAGNOSIS — K121 Other forms of stomatitis: Secondary | ICD-10-CM | POA: Diagnosis present

## 2014-12-12 DIAGNOSIS — E860 Dehydration: Secondary | ICD-10-CM

## 2014-12-12 DIAGNOSIS — Z7251 High risk heterosexual behavior: Secondary | ICD-10-CM | POA: Insufficient documentation

## 2014-12-12 DIAGNOSIS — M352 Behcet's disease: Secondary | ICD-10-CM | POA: Diagnosis present

## 2014-12-12 DIAGNOSIS — Z720 Tobacco use: Secondary | ICD-10-CM | POA: Diagnosis present

## 2014-12-12 DIAGNOSIS — L988 Other specified disorders of the skin and subcutaneous tissue: Secondary | ICD-10-CM | POA: Diagnosis not present

## 2014-12-12 DIAGNOSIS — Z791 Long term (current) use of non-steroidal anti-inflammatories (NSAID): Secondary | ICD-10-CM

## 2014-12-12 DIAGNOSIS — F17200 Nicotine dependence, unspecified, uncomplicated: Secondary | ICD-10-CM | POA: Diagnosis present

## 2014-12-12 DIAGNOSIS — R000 Tachycardia, unspecified: Secondary | ICD-10-CM | POA: Diagnosis present

## 2014-12-12 DIAGNOSIS — B349 Viral infection, unspecified: Secondary | ICD-10-CM | POA: Diagnosis present

## 2014-12-12 DIAGNOSIS — N509 Disorder of male genital organs, unspecified: Secondary | ICD-10-CM | POA: Diagnosis not present

## 2014-12-12 DIAGNOSIS — R7989 Other specified abnormal findings of blood chemistry: Secondary | ICD-10-CM | POA: Diagnosis not present

## 2014-12-12 DIAGNOSIS — N179 Acute kidney failure, unspecified: Secondary | ICD-10-CM | POA: Diagnosis present

## 2014-12-12 DIAGNOSIS — R1310 Dysphagia, unspecified: Secondary | ICD-10-CM | POA: Diagnosis present

## 2014-12-12 DIAGNOSIS — Z79899 Other long term (current) drug therapy: Secondary | ICD-10-CM | POA: Diagnosis not present

## 2014-12-12 DIAGNOSIS — M0230 Reiter's disease, unspecified site: Secondary | ICD-10-CM | POA: Insufficient documentation

## 2014-12-12 LAB — LACTIC ACID, PLASMA
LACTIC ACID, VENOUS: 1 mmol/L (ref 0.5–2.0)
LACTIC ACID, VENOUS: 0.9 mmol/L (ref 0.5–2.0)

## 2014-12-12 LAB — CBC WITH DIFFERENTIAL/PLATELET
BASOS ABS: 0 10*3/uL (ref 0.0–0.1)
Basophils Relative: 0 %
EOS PCT: 2 %
Eosinophils Absolute: 0.2 10*3/uL (ref 0.0–0.7)
HEMATOCRIT: 43.2 % (ref 39.0–52.0)
HEMOGLOBIN: 14.6 g/dL (ref 13.0–17.0)
LYMPHS ABS: 1.3 10*3/uL (ref 0.7–4.0)
LYMPHS PCT: 10 %
MCH: 28.9 pg (ref 26.0–34.0)
MCHC: 33.8 g/dL (ref 30.0–36.0)
MCV: 85.4 fL (ref 78.0–100.0)
Monocytes Absolute: 0.9 10*3/uL (ref 0.1–1.0)
Monocytes Relative: 8 %
NEUTROS ABS: 10 10*3/uL — AB (ref 1.7–7.7)
NEUTROS PCT: 80 %
Platelets: 298 10*3/uL (ref 150–400)
RBC: 5.06 MIL/uL (ref 4.22–5.81)
RDW: 12.4 % (ref 11.5–15.5)
WBC: 12.4 10*3/uL — AB (ref 4.0–10.5)

## 2014-12-12 LAB — COMPREHENSIVE METABOLIC PANEL
ALK PHOS: 48 U/L (ref 38–126)
ALT: 89 U/L — AB (ref 17–63)
AST: 71 U/L — AB (ref 15–41)
Albumin: 3.7 g/dL (ref 3.5–5.0)
Anion gap: 11 (ref 5–15)
BILIRUBIN TOTAL: 0.8 mg/dL (ref 0.3–1.2)
BUN: 10 mg/dL (ref 6–20)
CALCIUM: 9.1 mg/dL (ref 8.9–10.3)
CHLORIDE: 105 mmol/L (ref 101–111)
CO2: 21 mmol/L — ABNORMAL LOW (ref 22–32)
CREATININE: 1.27 mg/dL — AB (ref 0.61–1.24)
Glucose, Bld: 94 mg/dL (ref 65–99)
Potassium: 3.9 mmol/L (ref 3.5–5.1)
Sodium: 137 mmol/L (ref 135–145)
Total Protein: 7 g/dL (ref 6.5–8.1)

## 2014-12-12 LAB — I-STAT CHEM 8, ED
BUN: 9 mg/dL (ref 6–20)
CALCIUM ION: 1.15 mmol/L (ref 1.12–1.23)
CHLORIDE: 102 mmol/L (ref 101–111)
Creatinine, Ser: 1.5 mg/dL — ABNORMAL HIGH (ref 0.61–1.24)
Glucose, Bld: 115 mg/dL — ABNORMAL HIGH (ref 65–99)
HCT: 54 % — ABNORMAL HIGH (ref 39.0–52.0)
HEMOGLOBIN: 18.4 g/dL — AB (ref 13.0–17.0)
POTASSIUM: 4 mmol/L (ref 3.5–5.1)
SODIUM: 137 mmol/L (ref 135–145)
TCO2: 20 mmol/L (ref 0–100)

## 2014-12-12 LAB — HSV(HERPES SMPLX)ABS-I+II(IGG+IGM)-BLD
HSV 1 GLYCOPROTEIN G AB, IGG: 29.9 {index} — AB (ref 0.00–0.90)
HSVI/II COMB AB IGM: 1.03 ratio — AB (ref 0.00–0.90)

## 2014-12-12 LAB — URINE MICROSCOPIC-ADD ON

## 2014-12-12 LAB — URINALYSIS, ROUTINE W REFLEX MICROSCOPIC
Glucose, UA: NEGATIVE mg/dL
HGB URINE DIPSTICK: NEGATIVE
Ketones, ur: >80 mg/dL — AB
Nitrite: NEGATIVE
PROTEIN: 30 mg/dL — AB
SPECIFIC GRAVITY, URINE: 1.035 — AB (ref 1.005–1.030)
UROBILINOGEN UA: 1 mg/dL (ref 0.0–1.0)
pH: 6 (ref 5.0–8.0)

## 2014-12-12 LAB — RAPID URINE DRUG SCREEN, HOSP PERFORMED
AMPHETAMINES: NOT DETECTED
Barbiturates: NOT DETECTED
Benzodiazepines: NOT DETECTED
Cocaine: NOT DETECTED
OPIATES: POSITIVE — AB
Tetrahydrocannabinol: POSITIVE — AB

## 2014-12-12 LAB — TSH: TSH: 1.057 u[IU]/mL (ref 0.350–4.500)

## 2014-12-12 MED ORDER — SODIUM CHLORIDE 0.9 % IV BOLUS (SEPSIS)
1000.0000 mL | Freq: Once | INTRAVENOUS | Status: AC
  Administered 2014-12-12: 1000 mL via INTRAVENOUS

## 2014-12-12 MED ORDER — ENOXAPARIN SODIUM 40 MG/0.4ML ~~LOC~~ SOLN
40.0000 mg | Freq: Every day | SUBCUTANEOUS | Status: DC
  Administered 2014-12-12 – 2014-12-15 (×4): 40 mg via SUBCUTANEOUS
  Filled 2014-12-12 (×4): qty 0.4

## 2014-12-12 MED ORDER — HYDROMORPHONE HCL 1 MG/ML IJ SOLN
1.0000 mg | Freq: Once | INTRAMUSCULAR | Status: AC
  Administered 2014-12-12: 1 mg via INTRAVENOUS
  Filled 2014-12-12: qty 1

## 2014-12-12 MED ORDER — ACYCLOVIR SODIUM 50 MG/ML IV SOLN
400.0000 mg | Freq: Once | INTRAVENOUS | Status: AC
  Administered 2014-12-12: 400 mg via INTRAVENOUS
  Filled 2014-12-12: qty 8

## 2014-12-12 MED ORDER — DOXYCYCLINE HYCLATE 100 MG IV SOLR
100.0000 mg | Freq: Two times a day (BID) | INTRAVENOUS | Status: DC
Start: 2014-12-12 — End: 2014-12-13
  Administered 2014-12-12 – 2014-12-13 (×2): 100 mg via INTRAVENOUS
  Filled 2014-12-12 (×2): qty 100

## 2014-12-12 MED ORDER — IBUPROFEN 800 MG PO TABS
800.0000 mg | ORAL_TABLET | Freq: Once | ORAL | Status: AC
  Administered 2014-12-12: 800 mg via ORAL
  Filled 2014-12-12: qty 1

## 2014-12-12 MED ORDER — KETOROLAC TROMETHAMINE 30 MG/ML IJ SOLN
30.0000 mg | Freq: Once | INTRAMUSCULAR | Status: AC
  Administered 2014-12-12: 30 mg via INTRAVENOUS
  Filled 2014-12-12: qty 1

## 2014-12-12 MED ORDER — DEXTROSE 5 % IV SOLN
5.0000 mg/kg | Freq: Three times a day (TID) | INTRAVENOUS | Status: DC
  Administered 2014-12-13 (×2): 510 mg via INTRAVENOUS
  Filled 2014-12-12 (×3): qty 10.2

## 2014-12-12 MED ORDER — IPRATROPIUM-ALBUTEROL 0.5-2.5 (3) MG/3ML IN SOLN: 3.0000 mL | Freq: Four times a day (QID) | RESPIRATORY_TRACT | Status: DC

## 2014-12-12 MED ORDER — DEXTROSE 5 % IV SOLN
1.0000 g | INTRAVENOUS | Status: DC
  Administered 2014-12-12: 1 g via INTRAVENOUS
  Filled 2014-12-12: qty 10

## 2014-12-12 MED ORDER — HYDROMORPHONE HCL 1 MG/ML IJ SOLN
1.0000 mg | INTRAMUSCULAR | Status: DC | PRN
  Administered 2014-12-13 – 2014-12-15 (×12): 1 mg via INTRAVENOUS
  Filled 2014-12-12 (×12): qty 1

## 2014-12-12 MED ORDER — IPRATROPIUM-ALBUTEROL 0.5-2.5 (3) MG/3ML IN SOLN: 3.0000 mL | Freq: Four times a day (QID) | RESPIRATORY_TRACT | Status: DC | PRN

## 2014-12-12 MED ORDER — KETOROLAC TROMETHAMINE 30 MG/ML IJ SOLN
30.0000 mg | Freq: Four times a day (QID) | INTRAMUSCULAR | Status: DC | PRN
  Administered 2014-12-13 – 2014-12-14 (×4): 30 mg via INTRAVENOUS
  Filled 2014-12-12 (×4): qty 1

## 2014-12-12 MED ORDER — GUAIFENESIN-DM 100-10 MG/5ML PO SYRP
10.0000 mL | ORAL_SOLUTION | ORAL | Status: DC | PRN
  Administered 2014-12-12: 10 mL via ORAL
  Filled 2014-12-12: qty 10

## 2014-12-12 MED ORDER — POTASSIUM CHLORIDE IN NACL 20-0.9 MEQ/L-% IV SOLN
INTRAVENOUS | Status: DC
  Administered 2014-12-12 – 2014-12-15 (×6): via INTRAVENOUS
  Filled 2014-12-12 (×10): qty 1000

## 2014-12-12 MED ORDER — ONDANSETRON HCL 4 MG/2ML IJ SOLN
4.0000 mg | Freq: Once | INTRAMUSCULAR | Status: AC
  Administered 2014-12-12: 4 mg via INTRAVENOUS
  Filled 2014-12-12: qty 2

## 2014-12-12 MED ORDER — PANTOPRAZOLE SODIUM 40 MG PO TBEC
40.0000 mg | DELAYED_RELEASE_TABLET | Freq: Every day | ORAL | Status: DC
  Administered 2014-12-12 – 2014-12-16 (×4): 40 mg via ORAL
  Filled 2014-12-12 (×5): qty 1

## 2014-12-12 NOTE — Progress Notes (Signed)
[facility].  Patient with Medicaid insurance confirmed by Epic.  Medication cost with Medicaid insurance is generally three dollars or less.  Patient is not eligible for medication cost assistance.  No further EDCM needs at this time.

## 2014-12-12 NOTE — ED Notes (Signed)
Attempted to call report to the floor. Secretary stated nurse was coming in and they would call back for report when she gets here.

## 2014-12-12 NOTE — ED Notes (Signed)
Pt states he doesn't feel good and that all of this has been compounding over the last week. Was seen here yesterday and diagnosed with a viral syndrome. Today presents with tongue blister/ulcer, tachycardic at 133, generalized body aches, genital sores, dysuria.

## 2014-12-12 NOTE — ED Provider Notes (Signed)
CSN: [id number]     Arrival date & time 12/12/14  1452 History   First MD Initiated Contact with Patient 12/12/14 1501     Chief Complaint  Patient presents with  . Tachycardia     (Consider location/radiation/quality/duration/timing/severity/associated sxs/prior Treatment) Patient is a 24 y.o. male presenting with mouth sores.  Mouth Lesions Location:  Oropharynx Onset quality:  Gradual Duration:  2 days Progression:  Worsening Chronicity:  New Context: possible infection   Context: not a change in diet, not a change in medications, not medications and not stress   Relieved by:  Nothing Worsened by:  Nothing tried Ineffective treatments:  None tried Associated symptoms: no congestion, no neck pain and no rash     History reviewed. No pertinent past medical history. History reviewed. No pertinent past surgical history. History reviewed. No pertinent family history. Social History  Substance Use Topics  . Smoking status: Current Every Day Smoker  . Smokeless tobacco: None  . Alcohol Use: Yes    Review of Systems  HENT: Positive for mouth sores. Negative for congestion.   Musculoskeletal: Negative for neck pain.  Skin: Negative for rash.      Allergies  Review of patient's allergies indicates no known allergies.  Home Medications   Prior to Admission medications   Medication Sig Start Date End Date Taking? Authorizing Provider  albuterol (PROVENTIL HFA;VENTOLIN HFA) 108 (90 BASE) MCG/ACT inhaler Inhale 1 puff into the lungs every 6 (six) hours as needed for wheezing or shortness of breath.   Yes Historical Provider, MD  DM-Doxylamine-Acetaminophen (NYQUIL COLD & FLU PO) Take 2 tablets by mouth at bedtime as needed (for cold and flu symptoms).   Yes Historical Provider, MD  ibuprofen (ADVIL,MOTRIN) 800 MG tablet Take 1 tablet (800 mg total) by mouth 3 (three) times daily. 12/11/14  Yes Arby BarretteMarcy Pfeiffer, MD  Menthol (HALLS COUGH DROPS MT) Use as directed 1 drop in the  mouth or throat as needed (soreness).   Yes Historical Provider, MD  promethazine (PHENERGAN) 25 MG tablet Take 1 tablet (25 mg total) by mouth every 6 (six) hours as needed for nausea or vomiting. 12/11/14  Yes Arby BarretteMarcy Pfeiffer, MD  acetaminophen (TYLENOL) 500 MG tablet Take 2 tablets (1,000 mg total) by mouth every 6 (six) hours as needed. Patient not taking: Reported on 12/12/2014 12/11/14   Arby BarretteMarcy Pfeiffer, MD  azithromycin (ZITHROMAX Z-PAK) 250 MG tablet 2 po day one, then 1 daily x 4 days Patient not taking: Reported on 12/11/2014 06/29/14   Mancel BaleElliott Wentz, MD  oxyCODONE-acetaminophen (PERCOCET) 5-325 MG per tablet Take 1 tablet by mouth every 4 (four) hours as needed. Patient not taking: Reported on 12/11/2014 06/29/14   Mancel BaleElliott Wentz, MD   BP 141/91 mmHg  Pulse 108  Temp(Src) 99.6 F (37.6 C) (Oral)  Resp 22  Ht 5\' 10"  (1.778 m)  Wt 224 lb 14.4 oz (102.014 kg)  BMI 32.27 kg/m2  SpO2 98% Physical Exam  Constitutional: He appears well-developed and well-nourished.  HENT:  Head: Normocephalic and atraumatic.  Mouth/Throat: Oral lesions present.  Multiple ulcerated lesions on tongue, roof of mouth, cheeks  Neck: Normal range of motion.  Cardiovascular: Regular rhythm.  Tachycardia present.   Pulmonary/Chest: Effort normal. No respiratory distress.  Abdominal: He exhibits no distension.  Genitourinary:  Multiple ulcerated lesions on scrotum and penis  Musculoskeletal: Normal range of motion.  Neurological: He is alert.  Nursing note and vitals reviewed.   ED Course  Procedures (including critical care time) Labs Review  Labs Reviewed  URINE RAPID DRUG SCREEN, HOSP PERFORMED - Abnormal; Notable for the following:    Opiates POSITIVE (*)    Tetrahydrocannabinol POSITIVE (*)    All other components within normal limits  URINALYSIS, ROUTINE W REFLEX MICROSCOPIC (NOT AT [facility]) - Abnormal; Notable for the following:    Color, Urine AMBER (*)    APPearance CLOUDY (*)    Specific Gravity,  Urine 1.035 (*)    Bilirubin Urine MODERATE (*)    Ketones, ur >80 (*)    Protein, ur 30 (*)    Leukocytes, UA SMALL (*)    All other components within normal limits  CBC WITH DIFFERENTIAL/PLATELET - Abnormal; Notable for the following:    WBC 12.4 (*)    Neutro Abs 10.0 (*)    All other components within normal limits  COMPREHENSIVE METABOLIC PANEL - Abnormal; Notable for the following:    CO2 21 (*)    Creatinine, Ser 1.27 (*)    AST 71 (*)    ALT 89 (*)    All other components within normal limits  I-STAT CHEM 8, ED - Abnormal; Notable for the following:    Creatinine, Ser 1.50 (*)    Glucose, Bld 115 (*)    Hemoglobin 18.4 (*)    HCT 54.0 (*)    All other components within normal limits  TSH  LACTIC ACID, PLASMA  LACTIC ACID, PLASMA  URINE MICROSCOPIC-ADD ON  EPSTEIN-BARR VIRUS VCA, IGG  EPSTEIN-BARR VIRUS VCA, IGM  RSV(RESPIRATORY SYNCYTIAL VIRUS) AB, BLOOD  CMV IGM  CBC WITH DIFFERENTIAL/PLATELET  COMPREHENSIVE METABOLIC PANEL  LACTIC ACID, PLASMA  CBC  CREATININE, SERUM  COMPREHENSIVE METABOLIC PANEL  CBC WITH DIFFERENTIAL/PLATELET    Imaging Review Dg Chest 2 View  12/12/2014  CLINICAL DATA:  Hemoptysis.  First episode 6 days ago. EXAM: CHEST  2 VIEW COMPARISON:  12/11/2014 FINDINGS: The cardiac silhouette, mediastinal and hilar contours are within normal limits and stable. Low lung volumes with vascular crowding and streaky atelectasis. No infiltrates or effusions. No pneumothorax. The bony thorax is intact. IMPRESSION: Low lung volumes with vascular crowding and streaky atelectasis but no infiltrates or effusions. Electronically Signed   By: Rudie Meyer M.D.   On: 12/12/2014 15:58   Dg Chest 2 View  12/11/2014  CLINICAL DATA:  Cough, congestion, shortness of breath for 6 days, smoker EXAM: CHEST  2 VIEW COMPARISON:  None. FINDINGS: Cardiomediastinal silhouette is unremarkable. No acute infiltrate or pleural effusion. No pulmonary edema. Bony thorax is  unremarkable. IMPRESSION: No active cardiopulmonary disease. Electronically Signed   By: Natasha Mead M.D.   On: 12/11/2014 11:30   I have personally reviewed and evaluated these images and lab results as part of my medical decision-making.   EKG Interpretation None      MDM   Final diagnoses:  Other lesions of oral mucosa  Dehydration   24 yo M w/ worsening mouth and penile/scrotal lesions. With blood streaked sputum on coughing starting today. Tachycardic with multiple ulcerated lesions on mouth, GU area. No new drugs or fever to suggest SJS. No h/o autoimmune disease to suggest that as a cause but could be new onset crohns, UC or behcets. Could also be some type of acute viral syndrome as well.  Will check CXR to eval for pulmonary hemorrhage. Will recheck labs. Give fluids, ibuprofen, fentanyl and reassess after results.   Still with tachycardia. Mild dehydration.     Marily Memos, MD 12/12/14 2330

## 2014-12-13 DIAGNOSIS — M352 Behcet's disease: Secondary | ICD-10-CM | POA: Insufficient documentation

## 2014-12-13 DIAGNOSIS — H109 Unspecified conjunctivitis: Secondary | ICD-10-CM | POA: Insufficient documentation

## 2014-12-13 DIAGNOSIS — K121 Other forms of stomatitis: Secondary | ICD-10-CM | POA: Insufficient documentation

## 2014-12-13 DIAGNOSIS — M0230 Reiter's disease, unspecified site: Secondary | ICD-10-CM | POA: Insufficient documentation

## 2014-12-13 DIAGNOSIS — Z7253 High risk bisexual behavior: Secondary | ICD-10-CM

## 2014-12-13 DIAGNOSIS — N489 Disorder of penis, unspecified: Secondary | ICD-10-CM | POA: Insufficient documentation

## 2014-12-13 DIAGNOSIS — Z7251 High risk heterosexual behavior: Secondary | ICD-10-CM | POA: Insufficient documentation

## 2014-12-13 DIAGNOSIS — B349 Viral infection, unspecified: Secondary | ICD-10-CM

## 2014-12-13 DIAGNOSIS — K1379 Other lesions of oral mucosa: Secondary | ICD-10-CM | POA: Insufficient documentation

## 2014-12-13 DIAGNOSIS — H1033 Unspecified acute conjunctivitis, bilateral: Secondary | ICD-10-CM

## 2014-12-13 LAB — COMPREHENSIVE METABOLIC PANEL
ALK PHOS: 45 U/L (ref 38–126)
ALT: 71 U/L — ABNORMAL HIGH (ref 17–63)
ANION GAP: 12 (ref 5–15)
AST: 43 U/L — ABNORMAL HIGH (ref 15–41)
Albumin: 3.3 g/dL — ABNORMAL LOW (ref 3.5–5.0)
BILIRUBIN TOTAL: 1.1 mg/dL (ref 0.3–1.2)
BUN: 9 mg/dL (ref 6–20)
CALCIUM: 8.8 mg/dL — AB (ref 8.9–10.3)
CO2: 22 mmol/L (ref 22–32)
Chloride: 102 mmol/L (ref 101–111)
Creatinine, Ser: 1.24 mg/dL (ref 0.61–1.24)
GFR calc non Af Amer: >60 mL/min (ref >60)
Glucose, Bld: 93 mg/dL (ref 65–99)
POTASSIUM: 3.9 mmol/L (ref 3.5–5.1)
SODIUM: 136 mmol/L (ref 135–145)
TOTAL PROTEIN: 6.8 g/dL (ref 6.5–8.1)

## 2014-12-13 LAB — CBC WITH DIFFERENTIAL/PLATELET
BASOS ABS: 0 10*3/uL (ref 0.0–0.1)
BASOS PCT: 0 %
Eosinophils Absolute: 0.3 10*3/uL (ref 0.0–0.7)
Eosinophils Relative: 3 %
HEMATOCRIT: 41.2 % (ref 39.0–52.0)
HEMOGLOBIN: 13.9 g/dL (ref 13.0–17.0)
Lymphocytes Relative: 16 %
Lymphs Abs: 1.5 10*3/uL (ref 0.7–4.0)
MCH: 29 pg (ref 26.0–34.0)
MCHC: 33.7 g/dL (ref 30.0–36.0)
MCV: 85.8 fL (ref 78.0–100.0)
Monocytes Absolute: 1 10*3/uL (ref 0.1–1.0)
Monocytes Relative: 10 %
NEUTROS ABS: 6.6 10*3/uL (ref 1.7–7.7)
NEUTROS PCT: 71 %
Platelets: 298 10*3/uL (ref 150–400)
RBC: 4.8 MIL/uL (ref 4.22–5.81)
RDW: 12.3 % (ref 11.5–15.5)
WBC: 9.3 10*3/uL (ref 4.0–10.5)

## 2014-12-13 LAB — EPSTEIN-BARR VIRUS VCA, IGM

## 2014-12-13 LAB — EPSTEIN-BARR VIRUS VCA, IGG: EBV VCA IgG: 99 U/mL — ABNORMAL HIGH (ref 0.0–17.9)

## 2014-12-13 LAB — GC/CHLAMYDIA PROBE AMP (~~LOC~~) NOT AT ARMC
Chlamydia: NEGATIVE
NEISSERIA GONORRHEA: NEGATIVE

## 2014-12-13 LAB — CMV IGM

## 2014-12-13 MED ORDER — LEVALBUTEROL HCL 1.25 MG/0.5ML IN NEBU
1.2500 mg | INHALATION_SOLUTION | Freq: Four times a day (QID) | RESPIRATORY_TRACT | Status: DC | PRN
  Filled 2014-12-13: qty 0.5

## 2014-12-13 MED ORDER — ENSURE ENLIVE PO LIQD
237.0000 mL | Freq: Two times a day (BID) | ORAL | Status: DC
  Administered 2014-12-13 – 2014-12-14 (×2): 237 mL via ORAL

## 2014-12-13 MED ORDER — DEXTROSE 5 % IV SOLN
2.0000 g | INTRAVENOUS | Status: DC
  Administered 2014-12-13: 2 g via INTRAVENOUS
  Filled 2014-12-13 (×2): qty 2

## 2014-12-13 MED ORDER — IPRATROPIUM BROMIDE 0.02 % IN SOLN: 0.5000 mg | Freq: Four times a day (QID) | RESPIRATORY_TRACT | Status: DC | PRN

## 2014-12-13 MED ORDER — NICOTINE 14 MG/24HR TD PT24
14.0000 mg | MEDICATED_PATCH | Freq: Every day | TRANSDERMAL | Status: DC
  Administered 2014-12-15 – 2014-12-16 (×2): 14 mg via TRANSDERMAL
  Filled 2014-12-13 (×4): qty 1

## 2014-12-13 MED ORDER — METHYLPREDNISOLONE SODIUM SUCC 125 MG IJ SOLR
80.0000 mg | Freq: Two times a day (BID) | INTRAMUSCULAR | Status: DC
Start: 2014-12-13 — End: 2014-12-14
  Administered 2014-12-13 – 2014-12-14 (×2): 80 mg via INTRAVENOUS
  Filled 2014-12-13 (×2): qty 2

## 2014-12-13 MED ORDER — LIDOCAINE VISCOUS 2 % MT SOLN
15.0000 mL | OROMUCOSAL | Status: DC | PRN
  Administered 2014-12-13 – 2014-12-16 (×6): 15 mL via OROMUCOSAL
  Filled 2014-12-13 (×12): qty 15

## 2014-12-13 NOTE — Progress Notes (Signed)
Nutrition Brief Note  Patient identified on the Malnutrition Screening Tool (MST) Report  Wt Readings from Last 15 Encounters:  12/12/14 224 lb 14.4 oz (102.014 kg)  12/11/14 224 lb (101.606 kg)    Body mass index is 32.27 kg/(m^2). Patient meets criteria for obese based on current BMI.   Current diet order is soft, patient is consuming approximately unknown % of meals at this time. Labs and medications reviewed.   Pt is exhibiting oral ulcers, bleeding via mouth but reports his eating habits have no changed, appetite is still good.  Soft diet should be adequate, but will provide EEx2 to help achieve calorie needs until PO intake can be assessed.  No NTR diagnosis at this time. If further eval is needed, consult RD  Dionne AnoWilliam M. Alyaan Budzynski, MS, RD LDN After Hours/Weekend Pager [phone]

## 2014-12-13 NOTE — Consult Note (Addendum)
[facility] for [facility]    Date of Admission:  12/12/2014  Date of Consult:  12/13/2014  Reason for Consult: diffuse rash, oral ulcers dysphagia, penile rash Referring Physician: Dr. Broadus John   HPI: Julian Reynolds is an 24 y.o. male who presented to the returns=ed to the emergency department for the second time in  2 days due to mouth sores, sore throat, productive cough of whitish sputum, (upper extremities, trunk, scrotum and penile pustules and ulcerations) conjunctival inflammation, fever, chills, fatigue, malaise, decreased appetite for about 6 days. Per patient, he initially had a sore throat, nasal congestion and nonproductive cough, but subsequently after 3-4 days he developed oral ulcers, followed by skin pustules. In the ED during the first visit he received IVF, Rocephin and Zithromax after having urethral swab sent for GC and chlamydia. Note I have only found a negative GC and chlamydia report in 2016 though the admission H&P states that he had a positive swab. He was + for GC 4 years ago on testing in July of 2014.   He denies sick contacts or travel history.  The patient told admitting MD that the the last time he has sexual intercourse was about a year and a half ago.  I did not ask him re last sexual intercourse but did ask him if he had sex with men, women or both and he replied both. With men he has insertive but not receptive anal intercourse.   His penile ulcer is not painful but many of the other lesions on scrotum and other parts of his body are painful and pruritic. He has sparing of palms and soles.  There is no personal or family medical history of autoimmune disorders.  Patient was admitted and continued on IV rocephin wit IV acyclovir and placed in airborne precautions (for possible VZV)  His admission labs were pertinent for leukocytosis with pMN predominance, pyuria, negative group A strep, negative HIV, EBV panel c/w past  infection and also mild transaminatis.  History reviewed. No pertinent past medical history. Prior Gonococcal urethritis HSV1  History reviewed. No pertinent past surgical history. No prior surgeries  Social History:  reports that he has been smoking.  He does not have any smokeless tobacco history on file. He reports that he drinks alcohol. He reports that he does not use illicit drugs.   History reviewed. No pertinent family history. No hx of lupus in family, no CTD   No Known Allergies   Medications: I have reviewed patients current medications as documented in Epic Anti-infectives    Start     Dose/Rate Route Frequency Ordered Stop   12/13/14 1600  cefTRIAXone (ROCEPHIN) 2 g in dextrose 5 % 50 mL IVPB     2 g 100 mL/hr over 30 Minutes Intravenous Every 24 hours 12/13/14 1309     12/13/14 0000  acyclovir (ZOVIRAX) 510 mg in dextrose 5 % 100 mL IVPB  Status:  Discontinued     5 mg/kg  101.6 kg 110.2 mL/hr over 60 Minutes Intravenous Every 8 hours 12/12/14 2251 12/13/14 1311   12/12/14 2300  doxycycline (VIBRAMYCIN) 100 mg in dextrose 5 % 250 mL IVPB  Status:  Discontinued     100 mg 125 mL/hr over 120 Minutes Intravenous Every 12 hours 12/12/14 2251 12/13/14 1108   12/12/14 2130  cefTRIAXone (ROCEPHIN) 1 g in dextrose 5 % 50 mL IVPB  Status:  Discontinued     1  g 100 mL/hr over 30 Minutes Intravenous Every 24 hours 12/12/14 2117 12/13/14 1111   12/12/14 1745  acyclovir (ZOVIRAX) 400 mg in dextrose 5 % 100 mL IVPB     400 mg 108 mL/hr over 60 Minutes Intravenous  Once 12/12/14 1722 12/12/14 2113         ROS: as in HPI with worsening dysphagia and some bleeding from oral mucosa   Blood pressure 129/85, pulse 116, temperature 99.8 F (37.7 C), temperature source Oral, resp. rate 20, height 5' 10"  (1.778 m), weight 224 lb 14.4 oz (102.014 kg), SpO2 97 %. General: Alert and awake, oriented x3, not in any acute distress. HEENT: anicteric sclera,  EOMI conjunctival  irritation   12/12/14:    OP with exudate, ulcer, bleeding and lesions on lips  12/13/14         Lesions on chest, arms 111/4/16:        GU genital lesions  12/11/14:       12/13/14:         Cardiovascular: egular rate, normal r,  no murmur rubs or gallops Pulmonary: clear to auscultation bilaterally, no wheezing, rales or rhonchi Gastrointestinal: soft nontender, nondistended, normal bowel sounds, Musculoskeletal: no  clubbing or edema noted bilaterally Skin, soft tissue: no rashes Neuro: nonfocal, strength and sensation intact   Results for orders placed or performed during the hospital encounter of 12/12/14 (from the past 48 hour(s))  TSH     Status: None   Collection Time: 12/12/14  3:12 PM  Result Value Ref Range   TSH 1.057 0.350 - 4.500 uIU/mL  I-stat chem 8, ed     Status: Abnormal   Collection Time: 12/12/14  3:18 PM  Result Value Ref Range   Sodium 137 135 - 145 mmol/L   Potassium 4.0 3.5 - 5.1 mmol/L   Chloride 102 101 - 111 mmol/L   BUN 9 6 - 20 mg/dL   Creatinine, Ser 1.50 (H) 0.61 - 1.24 mg/dL   Glucose, Bld 115 (H) 65 - 99 mg/dL   Calcium, Ion 1.15 1.12 - 1.23 mmol/L   TCO2 20 0 - 100 mmol/L   Hemoglobin 18.4 (H) 13.0 - 17.0 g/dL   HCT 54.0 (H) 39.0 - 52.0 %  Epstein-Barr virus VCA, IgG     Status: Abnormal   Collection Time: 12/12/14  3:41 PM  Result Value Ref Range   EBV VCA IgG 99.0 (H) 0.0 - 17.9 U/mL    Comment: (NOTE)                                 Negative        <18.0                                 Equivocal 18.0 - 21.9                                 Positive        >21.9 Performed At: [facility] [place], [place] [id number] Lindon Romp MD [id number]   Epstein-Barr virus VCA, IgM     Status: None   Collection Time: 12/12/14  3:41 PM  Result Value Ref Range   EBV VCA IgM <36.0 0.0 - 35.9 U/mL    Comment: (NOTE)  Negative        <36.0                                  Equivocal 36.0 - 43.9                                 Positive        >43.9 Performed At: [facility] [place], [place] [id number] Lindon Romp MD [id number]   CMV IgM     Status: None   Collection Time: 12/12/14  3:41 PM  Result Value Ref Range   CMV IgM <30.0 0.0 - 29.9 AU/mL    Comment: (NOTE)                                Negative         <30.0                                Equivocal  30.0 - 34.9                                Positive         >34.9 A positive result is generally indicative of acute infection, reactivation or persistent IgM production. Performed At: [facility] [place], [place] [id number] Lindon Romp MD [id number]   CBC with Differential/Platelet     Status: Abnormal   Collection Time: 12/12/14  5:23 PM  Result Value Ref Range   WBC 12.4 (H) 4.0 - 10.5 K/uL   RBC 5.06 4.22 - 5.81 MIL/uL   Hemoglobin 14.6 13.0 - 17.0 g/dL    Comment: DELTA CHECK NOTED REPEATED TO VERIFY    HCT 43.2 39.0 - 52.0 %   MCV 85.4 78.0 - 100.0 fL   MCH 28.9 26.0 - 34.0 pg   MCHC 33.8 30.0 - 36.0 g/dL   RDW 12.4 11.5 - 15.5 %   Platelets 298 150 - 400 K/uL   Neutrophils Relative % 80 %   Neutro Abs 10.0 (H) 1.7 - 7.7 K/uL   Lymphocytes Relative 10 %   Lymphs Abs 1.3 0.7 - 4.0 K/uL   Monocytes Relative 8 %   Monocytes Absolute 0.9 0.1 - 1.0 K/uL   Eosinophils Relative 2 %   Eosinophils Absolute 0.2 0.0 - 0.7 K/uL   Basophils Relative 0 %   Basophils Absolute 0.0 0.0 - 0.1 K/uL  Comprehensive metabolic panel     Status: Abnormal   Collection Time: 12/12/14  5:23 PM  Result Value Ref Range   Sodium 137 135 - 145 mmol/L   Potassium 3.9 3.5 - 5.1 mmol/L   Chloride 105 101 - 111 mmol/L   CO2 21 (L) 22 - 32 mmol/L   Glucose, Bld 94 65 - 99 mg/dL   BUN 10 6 - 20 mg/dL   Creatinine, Ser 1.27 (H) 0.61 - 1.24 mg/dL   Calcium 9.1 8.9 - 10.3 mg/dL   Total Protein 7.0 6.5 - 8.1 g/dL   Albumin 3.7 3.5  - 5.0 g/dL   AST 71 (H) 15 - 41 U/L   ALT 89 (H) 17 - 63 U/L  Alkaline Phosphatase 48 38 - 126 U/L   Total Bilirubin 0.8 0.3 - 1.2 mg/dL   GFR calc non Af Amer >60 >60 mL/min   GFR calc Af Amer >60 >60 mL/min    Comment: (NOTE) The eGFR has been calculated using the CKD EPI equation. This calculation has not been validated in all clinical situations. eGFR's persistently <60 mL/min signify possible Chronic Kidney Disease.    Anion gap 11 5 - 15  Lactic acid, plasma     Status: None   Collection Time: 12/12/14  5:23 PM  Result Value Ref Range   Lactic Acid, Venous 1.0 0.5 - 2.0 mmol/L  Urine rapid drug screen (hosp performed)     Status: Abnormal   Collection Time: 12/12/14  5:36 PM  Result Value Ref Range   Opiates POSITIVE (A) NONE DETECTED   Cocaine NONE DETECTED NONE DETECTED   Benzodiazepines NONE DETECTED NONE DETECTED   Amphetamines NONE DETECTED NONE DETECTED   Tetrahydrocannabinol POSITIVE (A) NONE DETECTED   Barbiturates NONE DETECTED NONE DETECTED    Comment:        DRUG SCREEN FOR MEDICAL PURPOSES ONLY.  IF CONFIRMATION IS NEEDED FOR ANY PURPOSE, NOTIFY LAB WITHIN 5 DAYS.        LOWEST DETECTABLE LIMITS FOR URINE DRUG SCREEN Drug Class       Cutoff (ng/mL) Amphetamine      1000 Barbiturate      200 Benzodiazepine   638 Tricyclics       466 Opiates          300 Cocaine          300 THC              50   Urinalysis, Routine w reflex microscopic (not at [facility])     Status: Abnormal   Collection Time: 12/12/14  5:37 PM  Result Value Ref Range   Color, Urine AMBER (A) YELLOW    Comment: BIOCHEMICALS MAY BE AFFECTED BY COLOR   APPearance CLOUDY (A) CLEAR   Specific Gravity, Urine 1.035 (H) 1.005 - 1.030   pH 6.0 5.0 - 8.0   Glucose, UA NEGATIVE NEGATIVE mg/dL   Hgb urine dipstick NEGATIVE NEGATIVE   Bilirubin Urine MODERATE (A) NEGATIVE   Ketones, ur >80 (A) NEGATIVE mg/dL   Protein, ur 30 (A) NEGATIVE mg/dL   Urobilinogen, UA 1.0 0.0 - 1.0 mg/dL   Nitrite  NEGATIVE NEGATIVE   Leukocytes, UA SMALL (A) NEGATIVE  Urine microscopic-add on     Status: None   Collection Time: 12/12/14  5:37 PM  Result Value Ref Range   Squamous Epithelial / LPF RARE RARE   WBC, UA 21-50 <3 WBC/hpf   RBC / HPF 0-2 <3 RBC/hpf   Urine-Other MUCOUS PRESENT   Lactic acid, plasma     Status: None   Collection Time: 12/12/14  8:35 PM  Result Value Ref Range   Lactic Acid, Venous 0.9 0.5 - 2.0 mmol/L  Comprehensive metabolic panel     Status: Abnormal   Collection Time: 12/13/14  4:20 AM  Result Value Ref Range   Sodium 136 135 - 145 mmol/L   Potassium 3.9 3.5 - 5.1 mmol/L   Chloride 102 101 - 111 mmol/L   CO2 22 22 - 32 mmol/L   Glucose, Bld 93 65 - 99 mg/dL   BUN 9 6 - 20 mg/dL   Creatinine, Ser 1.24 0.61 - 1.24 mg/dL   Calcium 8.8 (L) 8.9 - 10.3 mg/dL  Total Protein 6.8 6.5 - 8.1 g/dL   Albumin 3.3 (L) 3.5 - 5.0 g/dL   AST 43 (H) 15 - 41 U/L   ALT 71 (H) 17 - 63 U/L   Alkaline Phosphatase 45 38 - 126 U/L   Total Bilirubin 1.1 0.3 - 1.2 mg/dL   GFR calc non Af Amer >60 >60 mL/min   GFR calc Af Amer >60 >60 mL/min    Comment: (NOTE) The eGFR has been calculated using the CKD EPI equation. This calculation has not been validated in all clinical situations. eGFR's persistently <60 mL/min signify possible Chronic Kidney Disease.    Anion gap 12 5 - 15  CBC WITH DIFFERENTIAL     Status: None   Collection Time: 12/13/14  4:20 AM  Result Value Ref Range   WBC 9.3 4.0 - 10.5 K/uL   RBC 4.80 4.22 - 5.81 MIL/uL   Hemoglobin 13.9 13.0 - 17.0 g/dL   HCT 41.2 39.0 - 52.0 %   MCV 85.8 78.0 - 100.0 fL   MCH 29.0 26.0 - 34.0 pg   MCHC 33.7 30.0 - 36.0 g/dL   RDW 12.3 11.5 - 15.5 %   Platelets 298 150 - 400 K/uL   Neutrophils Relative % 71 %   Neutro Abs 6.6 1.7 - 7.7 K/uL   Lymphocytes Relative 16 %   Lymphs Abs 1.5 0.7 - 4.0 K/uL   Monocytes Relative 10 %   Monocytes Absolute 1.0 0.1 - 1.0 K/uL   Eosinophils Relative 3 %   Eosinophils Absolute 0.3 0.0 -  0.7 K/uL   Basophils Relative 0 %   Basophils Absolute 0.0 0.0 - 0.1 K/uL   @BRIEFLABTABLE (sdes,specrequest,cult,reptstatus)   ) Recent Results (from the past 720 hour(s))  Culture, blood (routine x 2)     Status: None (Preliminary result)   Collection Time: 12/11/14 10:37 AM  Result Value Ref Range Status   Specimen Description BLOOD LEFT ANTECUBITAL  Final   Special Requests BOTTLES DRAWN AEROBIC AND ANAEROBIC 5ML  Final   Culture   Final    NO GROWTH 2 DAYS Performed at [facility]    Report Status PENDING  Incomplete  Culture, blood (routine x 2)     Status: None (Preliminary result)   Collection Time: 12/11/14 10:38 AM  Result Value Ref Range Status   Specimen Description BLOOD RIGHT FOREARM  Final   Special Requests BOTTLES DRAWN AEROBIC AND ANAEROBIC 5ML  Final   Culture   Final    NO GROWTH 2 DAYS Performed at [facility]    Report Status PENDING  Incomplete  Rapid strep screen     Status: None   Collection Time: 12/11/14 10:49 AM  Result Value Ref Range Status   Streptococcus, Group A Screen (Direct) NEGATIVE NEGATIVE Final    Comment: (NOTE) A Rapid Antigen test may result negative if the antigen level in the sample is below the detection level of this test. The FDA has not cleared this test as a stand-alone test therefore the rapid antigen negative result has reflexed to a Group A Strep culture.      Impression/Recommendation  Principal Problem:   Acute viral syndrome Active Problems:   Tobacco abuse disorder   Dehydration   Tachycardia   Gonococcal urethritis in male   AKI (acute kidney injury) ([place])   Abnormal LFTs   Julian Reynolds is a 24 y.o. male with severe ulcerative pathology of hard palate, vesicles on lips, conjunctivitis, penile lesion (one on  glans), painful pruritic ulcers on scrotum on his trunk and arms.  This is most likely a Non-ID or post-ID phenomena such as a "Reiter"s Reactive arthritis, or Behcet's type  presentation vs a severe allergic reaction.  Syphilis is of course in the differential as is acute HIV as well as viral infections such as coxsackie virus infection  I would recommend giving him potent corticosteroids given the severity of his ulcerations in his mouth with bleeding and dysphagia  I will continue rocephin IV in case we are dealing with syphilis (note definitive treatment would be 2.4 MU of benzathine PCN but there is possibility he could have syphilis with neuro involvement and therefore benefit from parenteral IV ceftriaxone  I am stopping his acyclovir as this is NOT disseminated VZV or HSV  If his history  Of being a "top" and not a bottom with intercourse is accurate then the negative chlamydia rules out active chlamydial infection. If he is also at times a bottom we should check a rectal probe for GC and chlamydia  I have also sent off ESR, CRP, Coxsackie virus panel and HIV RNA (his 4th gen assay with p24 was negative btw)  He should be monitored closely esp for worsening off his mucosal lesions and for eye pathology and may need to be seen by ophthalmology  Hx of being AA man who has sex with other men: I told him his lifetime risk for HIV is 50% and he clearly would benefit from being on PrEP and we can get him connected to PrEP in form of Truvada via our clinic and also he may be a candidate for our TRIUMPH trial.  Dr. Megan Salon will check in on the patient tomorrow.   12/13/2014, 4:26 PM   Thank you so much for this interesting consult  [place] [facility] [place] [phone] (pager) [phone] (office) 12/13/2014, 4:26 PM  Julian Reynolds 12/13/2014, 4:26 PM

## 2014-12-13 NOTE — Progress Notes (Signed)
Pt seen and examined, admitted earlier this am 24/M with no PMH admitted with generalized body aches, genital sores, sore throat, oral/pharyngeal ulcers for 2days, started with URI ?6days ago Has oral and cutaneous lesions, penile/scrotal ulcers, injected conjunctiva, low grade fever, SIRS, mildly elevated LFTs, abnormal UA. 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

## 2014-12-13 NOTE — H&P (Signed)
[facility] History and Physical  Dewarren Ledbetter NWG:[id number] DOB: 06/14/1990 DOA: 12/12/2014  Referring physician: Harold Hedge, M.D. PCP: [facility]   Chief Complaint: Mouth sores  HPI: Julian Reynolds is a 24 y.o. male with no pertinent past medical history who returns to the emergency department for the second time in the last 2 days due to mouth sores, sore throat, productive cough of whitish sputum, (upper extremities, trunk, scrotum and penile pustules and ulcerations), fever, chills, fatigue, malaise, decreased appetite for about 6 days. Per patient, he initially had a sore throat, nasal congestion and nonproductive cough, but subsequently after 34 days he developed oral ulcers, followed by skin pustules. Yesterday in the emergency department, he received IVF, Rocephin and Zithromax after having urethral swab positive for Neisseria gonorrheae. He is states that he felt better, but had to return today due to worsening of the symptoms. He denies sick contacts or travel history. Per patient, the last time he has sexual intercourse was about a year and a half ago. There is no personal or family medical history of autoimmune disorders.   When seen initially in the emergency department, he was febrile and his heart rate was in the high 130s. He received IV fluids and analgesics with partial relief. He looks acutely ill, but is in no acute distress. His heart rate is currently 104 bpm on the monitor.  Review of Systems:  Constitutional:  Positive Fevers, chills, weight loss, fatigue.  No night sweats. HEENT:  Positive for nasal congestion, Sore throat, Difficulty swallowing, headaches No Tooth/dental problems. No sneezing, itching, earache, post nasal drip.  Cardio-vascular:  No chest pain, Orthopnea, PND, swelling in lower extremities, anasarca, dizziness, palpitations  GI:  Positive loss of appetite  No heartburn, indigestion, abdominal pain, nausea, vomiting,  diarrhea, change in bowel habits,  Resp:  Mild shortness of breath with exertion or at rest. Positive productive cough as described above,                 no hemoptysis. No wheezing Skin:  Positive lesions as earlier mentioned.  GU:  no dysuria, change in color of urine, no urgency or frequency. No flank pain.  Musculoskeletal:  Positive for arthralgias and myalgias. No decreased range of motion. No back pain.  Psych:  Decreased total sleep time and sleep quality. No change in mood or affect. No depression or anxiety. No memory loss.   History reviewed. No pertinent past medical history. History reviewed. No pertinent past surgical history. Social History:  reports that he has been smoking.  He does not have any smokeless tobacco history on file. He reports that he drinks alcohol. He reports that he does not use illicit drugs.  No Known Allergies  History reviewed. No pertinent family history.   Prior to Admission medications   Medication Sig Start Date End Date Taking? Authorizing Provider  albuterol (PROVENTIL HFA;VENTOLIN HFA) 108 (90 BASE) MCG/ACT inhaler Inhale 1 puff into the lungs every 6 (six) hours as needed for wheezing or shortness of breath.   Yes Historical Provider, MD  DM-Doxylamine-Acetaminophen (NYQUIL COLD & FLU PO) Take 2 tablets by mouth at bedtime as needed (for cold and flu symptoms).   Yes Historical Provider, MD  ibuprofen (ADVIL,MOTRIN) 800 MG tablet Take 1 tablet (800 mg total) by mouth 3 (three) times daily. 12/11/14  Yes Arby Barrette, MD  Menthol (HALLS COUGH DROPS MT) Use as directed 1 drop in the mouth or throat as needed (soreness).   Yes  Historical Provider, MD  promethazine (PHENERGAN) 25 MG tablet Take 1 tablet (25 mg total) by mouth every 6 (six) hours as needed for nausea or vomiting. 12/11/14  Yes Arby Barrette, MD  acetaminophen (TYLENOL) 500 MG tablet Take 2 tablets (1,000 mg total) by mouth every 6 (six) hours as needed. Patient not taking:  Reported on 12/12/2014 12/11/14   Arby Barrette, MD  azithromycin (ZITHROMAX Z-PAK) 250 MG tablet 2 po day one, then 1 daily x 4 days Patient not taking: Reported on 12/11/2014 06/29/14   Mancel Bale, MD  oxyCODONE-acetaminophen (PERCOCET) 5-325 MG per tablet Take 1 tablet by mouth every 4 (four) hours as needed. Patient not taking: Reported on 12/11/2014 06/29/14   Mancel Bale, MD   Physical Exam: Filed Vitals:   12/12/14 2130 12/12/14 2200 12/12/14 2230 12/12/14 2254  BP: 163/97 151/90 155/94 141/91  Pulse: 112 104 101 108  Temp:    99.6 F (37.6 C)  TempSrc:    Oral  Resp: Height:     (1.778 m)  Weight:    102.014 kg (224 lb 14.4 oz)  SpO2: 96% 97% 96% 98%    Wt Readings from Last 3 Encounters:  12/12/14 102.014 kg (224 lb 14.4 oz)  12/11/14 101.606 kg (224 lb)    General:  Appears acutely ill, but in NAD. Eyes: PERRL, normal lids, irises. Injected conjunctiva ENT: grossly normal hearing, multiple ulcerated lesions on lips, palate, cheeks & tongue. Flow shows a positive whitish exudate. Neck: no LAD, masses or thyromegaly Cardiovascular: Tachycardic, no m/r/g. No LE edema. Telemetry: Sinus tachycardia at 104 bpm  Respiratory: Bilateral rhonchi. Normal respiratory effort. Abdomen: soft, ntnd Skin: Multiple pustules, some of them ulcerated, on face, upper and lower extremities, penis and scrotum.  Musculoskeletal: grossly normal tone BUE/BLE Psychiatric: grossly normal mood and affect, speech fluent and appropriate Neurologic: grossly non-focal.          Labs on Admission:  Basic Metabolic Panel:  Recent Labs Lab 12/11/14 1037 12/12/14 1518 12/12/14 1723  NA 136 137 137  K 4.4 4.0 3.9  CL 106 102 105  CO2 22  --  21*  GLUCOSE 116* 115* 94  BUN CREATININE 1.34* 1.50* 1.27*  CALCIUM 9.5  --  9.1   Liver Function Tests:  Recent Labs Lab 12/11/14 1037 12/12/14 1723  AST 77* 71*  ALT 98* 89*  ALKPHOS 50 48  BILITOT 0.9 0.8    PROT 8.2* 7.0  ALBUMIN 4.5 3.7    Recent Labs Lab 12/11/14 1037  LIPASE 23   CBC:  Recent Labs Lab 12/11/14 1037 12/12/14 1518 12/12/14 1723  WBC 10.7*  --  12.4*  NEUTROABS 7.9*  --  10.0*  HGB 16.4 18.4* 14.6  HCT 47.5 54.0* 43.2  MCV 84.5  --  85.4  PLT 351  --  298    Radiological Exams on Admission: Dg Chest 2 View  12/12/2014  CLINICAL DATA:  Hemoptysis.  First episode 6 days ago. EXAM: CHEST  2 VIEW COMPARISON:  12/11/2014 FINDINGS: The cardiac silhouette, mediastinal and hilar contours are within normal limits and stable. Low lung volumes with vascular crowding and streaky atelectasis. No infiltrates or effusions. No pneumothorax. The bony thorax is intact. IMPRESSION: Low lung volumes with vascular crowding and streaky atelectasis but no infiltrates or effusions. Electronically Signed   By: Rudie Meyer M.D.   On: 12/12/2014 15:58   Dg Chest 2 View  12/11/2014  CLINICAL  DATA:  Cough, congestion, shortness of breath for 6 days, smoker EXAM: CHEST  2 VIEW COMPARISON:  None. FINDINGS: Cardiomediastinal silhouette is unremarkable. No acute infiltrate or pleural effusion. No pulmonary edema. Bony thorax is unremarkable. IMPRESSION: No active cardiopulmonary disease. Electronically Signed   By: Natasha MeadLiviu  Pop M.D.   On: 12/11/2014 11:30      Assessment/Plan Principal Problem:   Acute viral syndrome Continue IV fluids. Continue acyclovir started in the emergency department. Follow-up viral serology studies. Continue antipyretics and analgesics.  Active Problems:   Gonococcal urethritis in male Given the patient's symptoms, I will start Rocephin and doxycycline IV. Paged infectious disease is on call earlier, awaiting for Dr. Orvan Falconerampbell response.    Tobacco abuse disorder Advised to cease smoking. Nicotine replacement therapy ordered.    Dehydration Continue rehydration with IV fluids.    Tachycardia Continue IV hydration and fever treatment as needed.     AKI  (acute kidney injury) (HCC) Continue rehydration with IV fluids and follow-up creatinine in the morning.    Abnormal LFTs Check acute hepatitis panel. Recheck LFTs in the morning.   Code Status: Full code. DVT Prophylaxis: Lovenox SQ. Family Communication:  Disposition Plan: Admit for symptomatic treatment and IV antibiotic therapy.  Time spent: Over 90 minutes were spent during the process of this admission.  Bobette Moavid Manuel Chanie Soucek [facility] Pager [phone].

## 2014-12-14 DIAGNOSIS — K121 Other forms of stomatitis: Secondary | ICD-10-CM

## 2014-12-14 DIAGNOSIS — L988 Other specified disorders of the skin and subcutaneous tissue: Secondary | ICD-10-CM

## 2014-12-14 DIAGNOSIS — H109 Unspecified conjunctivitis: Secondary | ICD-10-CM

## 2014-12-14 DIAGNOSIS — R7989 Other specified abnormal findings of blood chemistry: Secondary | ICD-10-CM

## 2014-12-14 DIAGNOSIS — N179 Acute kidney failure, unspecified: Secondary | ICD-10-CM

## 2014-12-14 LAB — COMPREHENSIVE METABOLIC PANEL
ALK PHOS: 51 U/L (ref 38–126)
ALT: 76 U/L — AB (ref 17–63)
ANION GAP: 12 (ref 5–15)
AST: 55 U/L — ABNORMAL HIGH (ref 15–41)
Albumin: 3.4 g/dL — ABNORMAL LOW (ref 3.5–5.0)
BILIRUBIN TOTAL: 0.7 mg/dL (ref 0.3–1.2)
BUN: 10 mg/dL (ref 6–20)
CALCIUM: 9.2 mg/dL (ref 8.9–10.3)
CO2: 21 mmol/L — ABNORMAL LOW (ref 22–32)
CREATININE: 1.13 mg/dL (ref 0.61–1.24)
Chloride: 103 mmol/L (ref 101–111)
Glucose, Bld: 140 mg/dL — ABNORMAL HIGH (ref 65–99)
Potassium: 4.2 mmol/L (ref 3.5–5.1)
SODIUM: 136 mmol/L (ref 135–145)
TOTAL PROTEIN: 7.3 g/dL (ref 6.5–8.1)

## 2014-12-14 LAB — CBC WITH DIFFERENTIAL/PLATELET
BASOS ABS: 0 10*3/uL (ref 0.0–0.1)
BASOS PCT: 0 %
EOS ABS: 0 10*3/uL (ref 0.0–0.7)
Eosinophils Relative: 0 %
HEMATOCRIT: 41.1 % (ref 39.0–52.0)
HEMOGLOBIN: 14.1 g/dL (ref 13.0–17.0)
Lymphocytes Relative: 8 %
Lymphs Abs: 0.9 10*3/uL (ref 0.7–4.0)
MCH: 28.6 pg (ref 26.0–34.0)
MCHC: 34.3 g/dL (ref 30.0–36.0)
MCV: 83.4 fL (ref 78.0–100.0)
Monocytes Absolute: 0.4 10*3/uL (ref 0.1–1.0)
Monocytes Relative: 4 %
NEUTROS ABS: 10.6 10*3/uL — AB (ref 1.7–7.7)
NEUTROS PCT: 88 %
Platelets: 344 10*3/uL (ref 150–400)
RBC: 4.93 MIL/uL (ref 4.22–5.81)
RDW: 12.3 % (ref 11.5–15.5)
WBC: 12 10*3/uL — ABNORMAL HIGH (ref 4.0–10.5)

## 2014-12-14 LAB — HEPATITIS PANEL, ACUTE
HEP A IGM: NEGATIVE
HEP B S AG: NEGATIVE
Hep B C IgM: NEGATIVE

## 2014-12-14 LAB — SEDIMENTATION RATE: SED RATE: 54 mm/h — AB (ref 0–16)

## 2014-12-14 LAB — RPR: RPR Ser Ql: NONREACTIVE

## 2014-12-14 LAB — RSV(RESPIRATORY SYNCYTIAL VIRUS) AB, BLOOD: RSV Ab: NEGATIVE

## 2014-12-14 LAB — C-REACTIVE PROTEIN: CRP: 25.4 mg/dL — AB (ref <1.0)

## 2014-12-14 LAB — CULTURE, GROUP A STREP

## 2014-12-14 MED ORDER — METHYLPREDNISOLONE SODIUM SUCC 125 MG IJ SOLR
60.0000 mg | Freq: Two times a day (BID) | INTRAMUSCULAR | Status: DC
  Administered 2014-12-14 – 2014-12-15 (×2): 60 mg via INTRAVENOUS
  Filled 2014-12-14 (×2): qty 2

## 2014-12-14 NOTE — Progress Notes (Addendum)
Patient ID: Danielle DessVernon Shave, male   DOB: [birth year], 24 y.o.   MRN: [id number]         [facility]    Date of Admission:  12/12/2014           Day 2 Solu-Medrol  Principal Problem:   Acute viral syndrome Active Problems:   Tobacco abuse disorder   Dehydration   Tachycardia   Gonococcal urethritis in male   AKI (acute kidney injury) (HCC)   Abnormal LFTs   Other lesions of oral mucosa   Behcet's disease (HCC)   Reiter's disease (HCC)   Bilateral conjunctivitis   Oral ulcer   Penile lesion   High risk sexual behavior   . enoxaparin (LOVENOX) injection  40 mg Subcutaneous QHS  . feeding supplement (ENSURE ENLIVE)  237 mL Oral BID BM  . methylPREDNISolone (SOLU-MEDROL) injection  60 mg Intravenous Q12H  . nicotine  14 mg Transdermal Daily  . pantoprazole  40 mg Oral Daily    SUBJECTIVE: He states that he is feeling a little bit better today. He says that he has had recurrent, severe mouth ulcers about twice each year for the past 4-5 years. They generally resolve spontaneously over about one week. He has never had skin lesions or eye problems before.  Review of Systems: Constitutional: positive for fevers, negative for anorexia, chills, sweats and weight loss  History reviewed. No pertinent past medical history.  Social History  Substance Use Topics  . Smoking status: Current Every Day Smoker  . Smokeless tobacco: None  . Alcohol Use: Yes    History reviewed. No pertinent family history. No Known Allergies  OBJECTIVE: Filed Vitals:   12/13/14 0447 12/13/14 1259 12/13/14 2138 12/14/14 0457  BP: 129/85 168/88 137/85 148/89  Pulse: 116 119 97 87  Temp: 99.8 F (37.7 C) 98.1 F (36.7 C) 97.3 F (36.3 C) 97.8 F (36.6 C)  TempSrc: Oral Oral Axillary Oral  Resp: 20 21 18 20   Height:      Weight:      SpO2: 97% 98% 98% 98%   Body mass index is 32.27 kg/(m^2).  General: He is uncomfortable due to the ulcers but otherwise in no  distress Skin: Diffuse, scattered soft blisters and ulcers Eyes: Peripheral conjunctival injection bilaterally Oral: Diffuse ulcers on hard palate, buccal mucosa and lips Lungs: Clear Cor: Regular S1 and S2 with no murmur Abdomen: Soft and nontender Joints and extremities: No acute inflammation  Lab Results Lab Results  Component Value Date   WBC 12.0* 12/14/2014   HGB 14.1 12/14/2014   HCT 41.1 12/14/2014   MCV 83.4 12/14/2014   PLT 344 12/14/2014    Lab Results  Component Value Date   CREATININE 1.13 12/14/2014   BUN 10 12/14/2014   NA 136 12/14/2014   K 4.2 12/14/2014   CL 103 12/14/2014   CO2 21* 12/14/2014    Lab Results  Component Value Date   ALT 76* 12/14/2014   AST 55* 12/14/2014   ALKPHOS 51 12/14/2014   BILITOT 0.7 12/14/2014     Microbiology: Recent Results (from the past 240 hour(s))  Culture, blood (routine x 2)     Status: None (Preliminary result)   Collection Time: 12/11/14 10:37 AM  Result Value Ref Range Status   Specimen Description BLOOD LEFT ANTECUBITAL  Final   Special Requests BOTTLES DRAWN AEROBIC AND ANAEROBIC 5ML  Final   Culture   Final    NO GROWTH 3 DAYS Performed at  [facility]    Report Status PENDING  Incomplete  Culture, blood (routine x 2)     Status: None (Preliminary result)   Collection Time: 12/11/14 10:38 AM  Result Value Ref Range Status   Specimen Description BLOOD RIGHT FOREARM  Final   Special Requests BOTTLES DRAWN AEROBIC AND ANAEROBIC  Final   Culture   Final    NO GROWTH 3 DAYS Performed at [facility]    Report Status PENDING  Incomplete  Rapid strep screen     Status: None   Collection Time: 12/11/14 10:49 AM  Result Value Ref Range Status   Streptococcus, Group A Screen (Direct) NEGATIVE NEGATIVE Final    Comment: (NOTE) A Rapid Antigen test may result negative if the antigen level in the sample is below the detection level of this test. The FDA has not cleared this test as a  stand-alone test therefore the rapid antigen negative result has reflexed to a Group A Strep culture.      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 He feels a little better today after institution of steroids.  PLAN: 1. Continue Solu-Medrol  Cliffton Asters, MD [facility] [phone] pager   [phone] cell 12/14/2014, 12:25 PM

## 2014-12-14 NOTE — Progress Notes (Signed)
[facility] PROGRESS NOTE  Julian Reynolds WUJ:[id number] DOB: 08/29/1990 DOA: 12/12/2014 [facility]CP: [facility]  Assessment/Plan: 1. Mucocutaneous ulcerations, penile ulcer, conjunctivitis -suspect Viral etiology -EBV 1gG positive but IgM negative -HSV 1 positive -continue supportive care, RPR negative will stop Ceftriaxone -started on IV solumedrol yesterday per Dr.Van Dams recs, no clinical response yet, ? Continue for 1 more day -IVF, viscous lidocaine, NSAIDs  2. DVt proph: lovenox  Code Status: Full Code Family Communication: none at bedside Disposition Plan: Home when improved  Consultants:  ID  HPI/Subjective: Feels about the same  Objective: Filed Vitals:   12/14/14 0457  BP: 148/89  Pulse: 87  Temp: 97.8 F (36.6 C)  Resp: 20    Intake/Output Summary (Last 24 hours) at 12/14/14 0932 Last data filed at 12/14/14 0731  Gross per 24 hour  Intake   2420 ml  Output   1150 ml  Net   1270 ml   Filed Weights   12/12/14 2254  Weight: 102.014 kg (224 lb 14.4 oz)    Exam:   General:  AAOx3  HEENT: hard palate with raw lesions  Cardiovascular: S1S2/RRR  Respiratory:CTAB  Abdomen: soft, Nt, BS present  Musculoskeletal: no edema  Skin: multiple raw lesions, overlying epidermis has desquamated  Data Reviewed: Basic Metabolic Panel:  Recent Labs Lab 12/11/14 1037 12/12/14 1518 12/12/14 1723 12/13/14 0420 12/14/14 0414  NA 136 137 137 136 136  K 4.4 4.0 3.9 3.9 4.2  CL 106 102 105 102 103  CO2 22  --  21* 22 21*  GLUCOSE 116* 115* 94 93 140*  BUN 8 9 10 9 10   CREATININE 1.34* 1.50* 1.27* 1.24 1.13  CALCIUM 9.5  --  9.1 8.8* 9.2   Liver Function Tests:  Recent Labs Lab 12/11/14 1037 12/12/14 1723 12/13/14 0420 12/14/14 0414  AST 77* 71* 43* 55*  ALT 98* 89* 71* 76*  ALKPHOS 50 48 45 51  BILITOT 0.9 0.8 1.1 0.7  PROT 8.2* 7.0 6.8 7.3  ALBUMIN 4.5 3.7 3.3* 3.4*    Recent Labs Lab 12/11/14 1037  LIPASE 23   No  results for input(s): AMMONIA in the last 168 hours. CBC:  Recent Labs Lab 12/11/14 1037 12/12/14 1518 12/12/14 1723 12/13/14 0420 12/14/14 0414  WBC 10.7*  --  12.4* 9.3 12.0*  NEUTROABS 7.9*  --  10.0* 6.6 10.6*  HGB 16.4 18.4* 14.6 13.9 14.1  HCT 47.5 54.0* 43.2 41.2 41.1  MCV 84.5  --  85.4 85.8 83.4  PLT 351  --  298 298 344   Cardiac Enzymes: No results for input(s): CKTOTAL, CKMB, CKMBINDEX, TROPONINI in the last 168 hours. BNP (last 3 results) No results for input(s): BNP in the last 8760 hours.  ProBNP (last 3 results) No results for input(s): PROBNP in the last 8760 hours.  CBG: No results for input(s): GLUCAP in the last 168 hours.  Recent Results (from the past 240 hour(s))  Culture, blood (routine x 2)     Status: None (Preliminary result)   Collection Time: 12/11/14 10:37 AM  Result Value Ref Range Status   Specimen Description BLOOD LEFT ANTECUBITAL  Final   Special Requests BOTTLES DRAWN AEROBIC AND ANAEROBIC 5ML  Final   Culture   Final    NO GROWTH 3 DAYS [facility]    Report Status PENDING  Incomplete  Culture, blood (routine x 2)     Status: None (Preliminary result)   Collection Time: 12/11/14 10:38 AM  Result Value Ref Range Status   Specimen Description BLOOD RIGHT FOREARM  Final   Special Requests BOTTLES DRAWN AEROBIC AND ANAEROBIC  Final   Culture   Final    NO GROWTH 3 DAYS Performed at [facility]    Report Status PENDING  Incomplete  Rapid strep screen     Status: None   Collection Time: 12/11/14 10:49 AM  Result Value Ref Range Status   Streptococcus, Group A Screen (Direct) NEGATIVE NEGATIVE Final    Comment: (NOTE) A Rapid Antigen test may result negative if the antigen level in the sample is below the detection level of this test. The FDA has not cleared this test as a stand-alone test therefore the rapid antigen negative result has reflexed to a Group A Strep culture.      Studies: Dg  Chest 2 View  12/12/2014  CLINICAL DATA:  Hemoptysis.  First episode 6 days ago. EXAM: CHEST  2 VIEW COMPARISON:  12/11/2014 FINDINGS: The cardiac silhouette, mediastinal and hilar contours are within normal limits and stable. Low lung volumes with vascular crowding and streaky atelectasis. No infiltrates or effusions. No pneumothorax. The bony thorax is intact. IMPRESSION: Low lung volumes with vascular crowding and streaky atelectasis but no infiltrates or effusions. Electronically Signed   By: Rudie Meyer M.D.   On: 12/12/2014 15:58    Scheduled Meds: . cefTRIAXone (ROCEPHIN)  IV  2 g Intravenous Q24H  . enoxaparin (LOVENOX) injection  40 mg Subcutaneous QHS  . feeding supplement (ENSURE ENLIVE)  237 mL Oral BID BM  . methylPREDNISolone (SOLU-MEDROL) injection  60 mg Intravenous Q12H  . nicotine  14 mg Transdermal Daily  . pantoprazole  40 mg Oral Daily   Continuous Infusions: . 0.9 % NaCl with KCl 20 mEq / L 125 mL/hr at 12/14/14 0109   Antibiotics Given (last 72 hours)    Date/Time Action Medication Dose Rate   12/12/14 2343 Given   doxycycline (VIBRAMYCIN) 100 mg in dextrose 5 % 250 mL IVPB 100 mg 125 mL/hr   12/13/14 0055 Given   acyclovir (ZOVIRAX) 510 mg in dextrose 5 % 100 mL IVPB 510 mg 110.2 mL/hr   12/13/14 0805 Given   acyclovir (ZOVIRAX) 510 mg in dextrose 5 % 100 mL IVPB 510 mg 110.2 mL/hr   12/13/14 0932 Given   doxycycline (VIBRAMYCIN) 100 mg in dextrose 5 % 250 mL IVPB 100 mg 125 mL/hr   12/13/14 1635 Given   cefTRIAXone (ROCEPHIN) 2 g in dextrose 5 % 50 mL IVPB 2 g 100 mL/hr      Principal Problem:   Acute viral syndrome Active Problems:   Tobacco abuse disorder   Dehydration   Tachycardia   Gonococcal urethritis in male   AKI (acute kidney injury) (HCC)   Abnormal LFTs   Other lesions of oral mucosa   Behcet's disease (HCC)   Reiter's disease (HCC)   Bilateral conjunctivitis   Oral ulcer   Penile lesion   High risk sexual behavior    Time spent:     [facility]  [facility] Pager [phone]. If 7PM-7AM, please contact night-coverage at [email or web address], password [facility] 12/14/2014, 9:32 AM  LOS: 2 days

## 2014-12-15 DIAGNOSIS — M352 Behcet's disease: Principal | ICD-10-CM

## 2014-12-15 DIAGNOSIS — N509 Disorder of male genital organs, unspecified: Secondary | ICD-10-CM

## 2014-12-15 LAB — CBC WITH DIFFERENTIAL/PLATELET
BASOS PCT: 0 %
Basophils Absolute: 0 10*3/uL (ref 0.0–0.1)
EOS ABS: 0 10*3/uL (ref 0.0–0.7)
EOS PCT: 0 %
HCT: 41.1 % (ref 39.0–52.0)
Hemoglobin: 14 g/dL (ref 13.0–17.0)
LYMPHS ABS: 1.1 10*3/uL (ref 0.7–4.0)
Lymphocytes Relative: 8 %
MCH: 29 pg (ref 26.0–34.0)
MCHC: 34.1 g/dL (ref 30.0–36.0)
MCV: 85.3 fL (ref 78.0–100.0)
MONO ABS: 0.9 10*3/uL (ref 0.1–1.0)
Monocytes Relative: 6 %
NEUTROS PCT: 86 %
Neutro Abs: 12.5 10*3/uL — ABNORMAL HIGH (ref 1.7–7.7)
PLATELETS: 394 10*3/uL (ref 150–400)
RBC: 4.82 MIL/uL (ref 4.22–5.81)
RDW: 12.5 % (ref 11.5–15.5)
WBC: 14.5 10*3/uL — AB (ref 4.0–10.5)

## 2014-12-15 LAB — HEPATITIS PANEL, ACUTE
HEP A IGM: NEGATIVE
HEP B C IGM: NEGATIVE
Hepatitis B Surface Ag: NEGATIVE

## 2014-12-15 LAB — COMPREHENSIVE METABOLIC PANEL
ALBUMIN: 3.6 g/dL (ref 3.5–5.0)
ALT: 76 U/L — ABNORMAL HIGH (ref 17–63)
ANION GAP: 12 (ref 5–15)
AST: 40 U/L (ref 15–41)
Alkaline Phosphatase: 48 U/L (ref 38–126)
BILIRUBIN TOTAL: 0.5 mg/dL (ref 0.3–1.2)
BUN: 19 mg/dL (ref 6–20)
CO2: 21 mmol/L — ABNORMAL LOW (ref 22–32)
Calcium: 9.5 mg/dL (ref 8.9–10.3)
Chloride: 106 mmol/L (ref 101–111)
Creatinine, Ser: 0.99 mg/dL (ref 0.61–1.24)
GFR calc Af Amer: >60 mL/min (ref >60)
GLUCOSE: 151 mg/dL — AB (ref 65–99)
POTASSIUM: 4.6 mmol/L (ref 3.5–5.1)
Sodium: 139 mmol/L (ref 135–145)
TOTAL PROTEIN: 7.3 g/dL (ref 6.5–8.1)

## 2014-12-15 LAB — URINALYSIS, ROUTINE W REFLEX MICROSCOPIC
BILIRUBIN URINE: NEGATIVE
GLUCOSE, UA: NEGATIVE mg/dL
HGB URINE DIPSTICK: NEGATIVE
Ketones, ur: NEGATIVE mg/dL
Leukocytes, UA: NEGATIVE
Nitrite: NEGATIVE
PROTEIN: NEGATIVE mg/dL
Specific Gravity, Urine: 1.019 (ref 1.005–1.030)
UROBILINOGEN UA: 1 mg/dL (ref 0.0–1.0)
pH: 6.5 (ref 5.0–8.0)

## 2014-12-15 LAB — HIV-1 RNA QUANT-NO REFLEX-BLD: LOG10 HIV-1 RNA: UNDETERMINED log10copy/mL

## 2014-12-15 MED ORDER — HYDROCODONE-ACETAMINOPHEN 5-325 MG PO TABS
1.0000 | ORAL_TABLET | ORAL | Status: DC | PRN
  Administered 2014-12-15 – 2014-12-16 (×3): 2 via ORAL
  Filled 2014-12-15 (×3): qty 2

## 2014-12-15 MED ORDER — POLYETHYLENE GLYCOL 3350 17 G PO PACK: 17.0000 g | PACK | Freq: Every day | ORAL | Status: DC

## 2014-12-15 MED ORDER — SENNOSIDES-DOCUSATE SODIUM 8.6-50 MG PO TABS
1.0000 | ORAL_TABLET | Freq: Two times a day (BID) | ORAL | Status: DC
Start: 2014-12-15 — End: 2014-12-16
  Administered 2014-12-15: 1 via ORAL
  Filled 2014-12-15: qty 1

## 2014-12-15 MED ORDER — METHYLPREDNISOLONE SODIUM SUCC 40 MG IJ SOLR
40.0000 mg | Freq: Two times a day (BID) | INTRAMUSCULAR | Status: DC
  Administered 2014-12-15 – 2014-12-16 (×2): 40 mg via INTRAVENOUS
  Filled 2014-12-15 (×2): qty 1

## 2014-12-15 NOTE — Progress Notes (Signed)
Patient ID: Julian Reynolds, male   DOB: Apr 02, 1990, 24 y.o.   MRN: [id number]         [facility]    Date of Admission:  12/12/2014           Day 3 Solu-Medrol  Principal Problem:   Acute viral syndrome Active Problems:   Tobacco abuse disorder   Dehydration   Tachycardia   Gonococcal urethritis in male   AKI (acute kidney injury) (HCC)   Abnormal LFTs   Other lesions of oral mucosa   Behcet's disease (HCC)   Reiter's disease (HCC)   Bilateral conjunctivitis   Oral ulcer   Penile lesion   High risk sexual behavior   . enoxaparin (LOVENOX) injection  40 mg Subcutaneous QHS  . feeding supplement (ENSURE ENLIVE)  237 mL Oral BID BM  . methylPREDNISolone (SOLU-MEDROL) injection  60 mg Intravenous Q12H  . nicotine  14 mg Transdermal Daily  . pantoprazole  40 mg Oral Daily    SUBJECTIVE: He is feeling better today. He notes one new skin lesion on the dorsum of his right hand but otherwise feels like his lesions are slowly improving. His mouth ulcers are less sore. His eyes are not bothering him as much. His scrotal lesions are not as painful.  Review of Systems: Pertinent items are noted in HPI.  History reviewed. No pertinent past medical history.  Social History  Substance Use Topics  . Smoking status: Current Every Day Smoker  . Smokeless tobacco: None  . Alcohol Use: Yes    History reviewed. No pertinent family history. No Known Allergies  OBJECTIVE: Filed Vitals:   12/14/14 0457 12/14/14 1356 12/14/14 2203 12/15/14 0650  BP: 148/89 147/90 137/82 133/86  Pulse: 87 92 81 70  Temp: 97.8 F (36.6 C) 98 F (36.7 C) 98.5 F (36.9 C) 98.1 F (36.7 C)  TempSrc: Oral Oral Oral Oral  Resp: Height:      Weight:      SpO2: 98% 99% 99% 99%   Body mass index is 32.27 kg/(m^2).  General: he appears more comfortable Skin: there is one new vesicle on the dorsum of his right hand. All other skin lesions appear the same Eyes:  Decreased conjunctival injection Oral: Oral mucosa and lips continue to peel Lungs: clear Cor: regular S1 and S2 with no murmur Abdomen: soft and nontender Joints: No acute inflammation  Lab Results Lab Results  Component Value Date   WBC 14.5* 12/15/2014   HGB 14.0 12/15/2014   HCT 41.1 12/15/2014   MCV 85.3 12/15/2014   PLT 394 12/15/2014    Lab Results  Component Value Date   CREATININE 0.99 12/15/2014   BUN 19 12/15/2014   NA 139 12/15/2014   K 4.6 12/15/2014   CL 106 12/15/2014   CO2 21* 12/15/2014    Lab Results  Component Value Date   ALT 76* 12/15/2014   AST 40 12/15/2014   ALKPHOS 48 12/15/2014   BILITOT 0.5 12/15/2014     Microbiology: Recent Results (from the past 240 hour(s))  Culture, blood (routine x 2)     Status: None (Preliminary result)   Collection Time: 12/11/14 10:37 AM  Result Value Ref Range Status   Specimen Description BLOOD LEFT ANTECUBITAL  Final   Special Requests BOTTLES DRAWN AEROBIC AND ANAEROBIC  Final   Culture   Final    NO GROWTH 3 DAYS Performed at [facility]  Report Status PENDING  Incomplete  Culture, blood (routine x 2)     Status: None (Preliminary result)   Collection Time: 12/11/14 10:38 AM  Result Value Ref Range Status   Specimen Description BLOOD RIGHT FOREARM  Final   Special Requests BOTTLES DRAWN AEROBIC AND ANAEROBIC 5ML  Final   Culture   Final    NO GROWTH 3 DAYS [facility]formed [facility]    Report Status PENDING  Incomplete  Rapid strep screen     Status: None   Collection Time: 12/11/14 10:49 AM  Result Value Ref Range Status   Streptococcus, Group A Screen (Direct) NEGATIVE NEGATIVE Final    Comment: (NOTE) A Rapid Antigen test may result negative if the antigen level in the sample is below the detection level of this test. The FDA has not cleared this test as a stand-alone test therefore the rapid antigen negative result has reflexed to a Group A Strep culture.   Culture,  Group A Strep     Status: Abnormal   Collection Time: 12/11/14 10:49 AM  Result Value Ref Range Status   Strep A Culture Comment (A)  Final    Comment: (NOTE) Beta-hemolytic colonies, not group A Streptococcus isolated. Performed At: [facility] [address] [place], [place] [id number] Mila HomerHancock William F MD WU:[id number]:[phone]      ASSESSMENT: He is improving temporally with the addition of steroids. He would like to be discharged tomorrow morning to attend a funeral. I would consider a brief tapering course of prednisone.  PLAN: 1. Continue steroids  Julian AstersJohn Eilee Schader, MD [facility] [phone] pager   [phone] cell 12/15/2014, 8:26 AM

## 2014-12-15 NOTE — Progress Notes (Signed)
Pt c/o burning with urination and a green discharge from penis.  MD notified, per MD will obtain UA.

## 2014-12-15 NOTE — Progress Notes (Signed)
[facility] PROGRESS NOTE  Julian DessVernon Reynolds ZOX:[id number] DOB: September 21, 1990 DOA: 12/12/2014 [facility]  Assessment/Plan: 1. Mucocutaneous ulcerations, penile ulcer, conjunctivitis -Viral etiology vs behcets variant -EBV 1gG positive but IgM negative -HSV 1 positive -continue supportive care, RPR negative, stopped Ceftriaxone -started on IV solumedrol per Dr.Van Dams recs -cut down solumedrol, improving now -IVF, viscous lidocaine, NSAIDs -home tomorrow on Prednisone taper  2. DVt proph: lovenox  Code Status: Full Code Family Communication: none at bedside Disposition Plan: Home ? tomorrow  Consultants:  ID  HPI/Subjective: Feeling better, lesions healing  Objective: Filed Vitals:   12/15/14 0650  BP: 133/86  Pulse: 70  Temp: 98.1 F (36.7 C)  Resp: 18    Intake/Output Summary (Last 24 hours) at 12/15/14 1158 Last data filed at 12/15/14 0938  Gross per 24 hour  Intake 2600.13 ml  Output    451 ml  Net 2149.13 ml   Filed Weights   12/12/14 2254  Weight: 102.014 kg (224 lb 14.4 oz)    Exam:   General:  AAOx3  HEENT: hard palate with raw lesions-much improved  Cardiovascular: S1S2/RRR  Respiratory:CTAB  Abdomen: soft, Nt, BS present  Musculoskeletal: no edema  Skin: multiple raw lesions, overlying epidermis has desquamated  Data Reviewed: Basic Metabolic Panel:  Recent Labs Lab 12/11/14 1037 12/12/14 1518 12/12/14 1723 12/13/14 0420 12/14/14 0414 12/15/14 0413  NA 136 137 137 136 136 139  K 4.4 4.0 3.9 3.9 4.2 4.6  CL 106 102 105 102 103 106  CO2 22  --  21* 22 21* 21*  GLUCOSE 116* 115* 94 93 140* 151*  BUN 8 9 10 9 10 19   CREATININE 1.34* 1.50* 1.27* 1.24 1.13 0.99  CALCIUM 9.5  --  9.1 8.8* 9.2 9.5   Liver Function Tests:  Recent Labs Lab 12/11/14 1037 12/12/14 1723 12/13/14 0420 12/14/14 0414 12/15/14 0413  AST 77* 71* 43* 55* 40  ALT 98* 89* 71* 76* 76*  ALKPHOS 50 48 45 51 48  BILITOT 0.9 0.8 1.1  0.7 0.5  PROT 8.2* 7.0 6.8 7.3 7.3  ALBUMIN 4.5 3.7 3.3* 3.4* 3.6    Recent Labs Lab 12/11/14 1037  LIPASE 23   No results for input(s): AMMONIA in the last 168 hours. CBC:  Recent Labs Lab 12/11/14 1037 12/12/14 1518 12/12/14 1723 12/13/14 0420 12/14/14 0414 12/15/14 0413  WBC 10.7*  --  12.4* 9.3 12.0* 14.5*  NEUTROABS 7.9*  --  10.0* 6.6 10.6* 12.5*  HGB 16.4 18.4* 14.6 13.9 14.1 14.0  HCT 47.5 54.0* 43.2 41.2 41.1 41.1  MCV 84.5  --  85.4 85.8 83.4 85.3  PLT 351  --  298 298 344 394   Cardiac Enzymes: No results for input(s): CKTOTAL, CKMB, CKMBINDEX, TROPONINI in the last 168 hours. BNP (last 3 results) No results for input(s): BNP in the last 8760 hours.  ProBNP (last 3 results) No results for input(s): PROBNP in the last 8760 hours.  CBG: No results for input(s): GLUCAP in the last 168 hours.  Recent Results (from the past 240 hour(s))  Culture, blood (routine x 2)     Status: None (Preliminary result)   Collection Time: 12/11/14 10:37 AM  Result Value Ref Range Status   Specimen Description BLOOD LEFT ANTECUBITAL  Final   Special Requests BOTTLES DRAWN AEROBIC AND ANAEROBIC 5ML  Final   Culture   Final    NO GROWTH 4 DAYS Performed [facility]    Report Status  PENDING  Incomplete  Culture, blood (routine x 2)     Status: None (Preliminary result)   Collection Time: 12/11/14 10:38 AM  Result Value Ref Range Status   Specimen Description BLOOD RIGHT FOREARM  Final   Special Requests BOTTLES DRAWN AEROBIC AND ANAEROBIC  Final   Culture   Final    NO GROWTH 4 DAYS Performed at [facility]    Report Status PENDING  Incomplete  Rapid strep screen     Status: None   Collection Time: 12/11/14 10:49 AM  Result Value Ref Range Status   Streptococcus, Group A Screen (Direct) NEGATIVE NEGATIVE Final    Comment: (NOTE) A Rapid Antigen test may result negative if the antigen level in the sample is below the detection level of this  test. The FDA has not cleared this test as a stand-alone test therefore the rapid antigen negative result has reflexed to a Group A Strep culture.   Culture, Group A Strep     Status: Abnormal   Collection Time: 12/11/14 10:49 AM  Result Value Ref Range Status   Strep A Culture Comment (A)  Final    Comment: (NOTE) Beta-hemolytic colonies, not group A Streptococcus isolated. Performed At: [facility] [address] [place], [place] [id number] Mila Homer MD QM:[id number]      Studies: No results found.  Scheduled Meds: . enoxaparin (LOVENOX) injection  40 mg Subcutaneous QHS  . feeding supplement (ENSURE ENLIVE)  237 mL Oral BID BM  . methylPREDNISolone (SOLU-MEDROL) injection  40 mg Intravenous Q12H  . nicotine  14 mg Transdermal Daily  . pantoprazole  40 mg Oral Daily  . polyethylene glycol  17 g Oral Daily   Continuous Infusions: . 0.9 % NaCl with KCl 20 mEq / L 50 mL/hr at 12/15/14 0844   Antibiotics Given (last 72 hours)    Date/Time Action Medication Dose Rate   12/12/14 2343 Given   doxycycline (VIBRAMYCIN) 100 mg in dextrose 5 % 250 mL IVPB 100 mg 125 mL/hr   12/13/14 0055 Given   acyclovir (ZOVIRAX) 510 mg in dextrose 5 % 100 mL IVPB 510 mg 110.2 mL/hr   12/13/14 0805 Given   acyclovir (ZOVIRAX) 510 mg in dextrose 5 % 100 mL IVPB 510 mg 110.2 mL/hr   12/13/14 0932 Given   doxycycline (VIBRAMYCIN) 100 mg in dextrose 5 % 250 mL IVPB 100 mg 125 mL/hr   12/13/14 1635 Given   cefTRIAXone (ROCEPHIN) 2 g in dextrose 5 % 50 mL IVPB 2 g 100 mL/hr      Principal Problem:   Acute viral syndrome Active Problems:   Tobacco abuse disorder   Dehydration   Tachycardia   Gonococcal urethritis in male   AKI (acute kidney injury) (HCC)   Abnormal LFTs   Other lesions of oral mucosa   Behcet's disease (HCC)   Reiter's disease (HCC)   Bilateral conjunctivitis   Oral ulcer   Penile lesion   High risk sexual behavior    Time spent:     [facility]  [facility] Pager [phone]. If 7PM-7AM, please contact night-coverage at [email or web address], password [facility] 12/15/2014, 11:58 AM  LOS: 3 days

## 2014-12-16 LAB — CULTURE, BLOOD (ROUTINE X 2)
CULTURE: NO GROWTH
CULTURE: NO GROWTH

## 2014-12-16 MED ORDER — PREDNISONE 20 MG PO TABS: ORAL_TABLET | ORAL | Status: AC

## 2014-12-16 MED ORDER — LIDOCAINE VISCOUS 2 % MT SOLN: 15.0000 mL | OROMUCOSAL | Status: DC | PRN

## 2014-12-16 MED ORDER — HYDROCODONE-ACETAMINOPHEN 5-325 MG PO TABS: 1.0000 | ORAL_TABLET | Freq: Four times a day (QID) | ORAL | Status: AC | PRN

## 2014-12-16 MED ORDER — IBUPROFEN 800 MG PO TABS: 800.0000 mg | ORAL_TABLET | Freq: Four times a day (QID) | ORAL | Status: AC | PRN

## 2014-12-16 NOTE — Progress Notes (Signed)
Patient discharged home, all discharge medications and instructions reviewed and questions answered. Patient to be assisted to vehicle by wheelchair.  

## 2014-12-16 NOTE — Discharge Summary (Signed)
Physician Discharge Summary  Julian Reynolds ZOX:[id number] DOB: 02/26/1990 DOA: 12/12/2014  PCP: [facility]  Admit date: 12/12/2014 Discharge date: 12/16/2014  Time spent: 35 minutes  Recommendations for Outpatient Follow-up:  1. Dr.Van Dam [facility] in 1 month  Discharge Diagnoses:  Principal Problem:   Acute viral syndrome Active Problems:   Tobacco abuse disorder   Dehydration   Tachycardia   Gonococcal urethritis in male   AKI (acute kidney injury) (HCC)   Abnormal LFTs   Other lesions of oral mucosa   Behcet's disease (HCC)   Bilateral conjunctivitis   Oral ulcer   Penile lesion   High risk sexual behavior   Discharge Condition: improved  Diet recommendation:regular  Filed Weights   12/12/14 2254  Weight: 102.014 kg (224 lb 14.4 oz)    History of present illness:  Chief Complaint: Mouth sores Julian Reynolds is an 24 y.o. male who presented to the returns=ed to the emergency department for the second time in 2 days due to mouth sores, sore throat, productive cough of whitish sputum, (upper extremities, trunk, scrotum and penile pustules and ulcerations) conjunctival inflammation, fever, chills, fatigue, malaise, decreased appetite for about 6 days.  Hospital Course:  1. 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 -Improved, oral lesions healing now, skin lesions improving too,  eating, vitals improved, discharged home on Prednisone taper and advised to FU in ID Clinic in 1 month  Consultations:  ID Dr.Van Dam and Dr.Campbell  Discharge Exam: Filed Vitals:   12/16/14 0615  BP: 145/87  Pulse: 75  Temp: 97.9 F (36.6 C)  Resp: 16    General: AAOx3 Cardiovascular: S1S2/RRR Respiratory:  CTAB  Discharge Instructions   Discharge Instructions    Diet general    Complete by:  As directed      Increase activity slowly    Complete by:  As directed           Discharge Medication List as of 12/16/2014 11:47 AM    START taking these medications   Details  HYDROcodone-acetaminophen (NORCO/VICODIN) 5-325 MG tablet Take 1 tablet by mouth every 6 (six) hours as needed for severe pain., Starting 12/16/2014, Until Discontinued, Print    lidocaine (XYLOCAINE) 2 % solution Use as directed 15 mLs in the mouth or throat every 4 (four) hours as needed for mouth pain., Starting 12/16/2014, Until Discontinued, Print    predniSONE (DELTASONE) 20 MG tablet Take  for 3days then  for 3days then  for 3days then STOP, Print      CONTINUE these medications which have CHANGED   Details  ibuprofen (ADVIL,MOTRIN) 800 MG tablet Take 1 tablet (800 mg total) by mouth every 6 (six) hours as needed., Starting 12/16/2014, Until Discontinued, OTC      CONTINUE these medications which have NOT CHANGED   Details  albuterol (PROVENTIL HFA;VENTOLIN HFA) 108 (90 BASE) MCG/ACT inhaler Inhale 1 puff into the lungs every 6 (six) hours as needed for wheezing or shortness of breath., Until Discontinued, Historical Med    Menthol (HALLS COUGH DROPS MT) Use as directed 1 drop in the mouth or throat as needed (soreness)., Until Discontinued, Historical Med    promethazine (PHENERGAN) 25 MG tablet Take 1 tablet (25 mg total) by mouth every 6 (six) hours as needed  for nausea or vomiting., Starting 12/11/2014, Until Discontinued, Print      STOP taking these medications     DM-Doxylamine-Acetaminophen (NYQUIL COLD & FLU PO)      acetaminophen (TYLENOL) 500 MG tablet      azithromycin (ZITHROMAX Z-PAK) 250 MG tablet      oxyCODONE-acetaminophen (PERCOCET) 5-325 MG per tablet        No Known Allergies Follow-up Information    Follow up with Acey Lavornelius Van Dam, MD. Schedule an appointment as soon  as possible for a visit in 1 month.   Specialty:  Infectious Diseases   Contact information:   [address] [place] [address] [phone]        The results of significant diagnostics from this hospitalization (including imaging, microbiology, ancillary and laboratory) are listed below for reference.    Significant Diagnostic Studies: Dg Chest 2 View  12/12/2014  CLINICAL DATA:  Hemoptysis.  First episode 6 days ago. EXAM: CHEST  2 VIEW COMPARISON:  12/11/2014 FINDINGS: The cardiac silhouette, mediastinal and hilar contours are within normal limits and stable. Low lung volumes with vascular crowding and streaky atelectasis. No infiltrates or effusions. No pneumothorax. The bony thorax is intact. IMPRESSION: Low lung volumes with vascular crowding and streaky atelectasis but no infiltrates or effusions. Electronically Signed   By: Rudie MeyerP.  Gallerani M.D.   On: 12/12/2014 15:58   Dg Chest 2 View  12/11/2014  CLINICAL DATA:  Cough, congestion, shortness of breath for 6 days, smoker EXAM: CHEST  2 VIEW COMPARISON:  None. FINDINGS: Cardiomediastinal silhouette is unremarkable. No acute infiltrate or pleural effusion. No pulmonary edema. Bony thorax is unremarkable. IMPRESSION: No active cardiopulmonary disease. Electronically Signed   By: Natasha MeadLiviu  Pop M.D.   On: 12/11/2014 11:30    Microbiology: Recent Results (from the past 240 hour(s))  Culture, blood (routine x 2)     Status: None   Collection Time: 12/11/14 10:37 AM  Result Value Ref Range Status   Specimen Description BLOOD LEFT ANTECUBITAL  Final   Special Requests BOTTLES DRAWN AEROBIC AND ANAEROBIC 5ML  Final   Culture   Final    NO GROWTH 5 DAYS Performed at [facility]    Report Status 12/16/2014 FINAL  Final  Culture, blood (routine x 2)     Status: None   Collection Time: 12/11/14 10:38 AM  Result Value Ref Range Status   Specimen Description BLOOD RIGHT FOREARM  Final   Special Requests  BOTTLES DRAWN AEROBIC AND ANAEROBIC 5ML  Final   Culture   Final    NO GROWTH 5 DAYS Performed at [facility]    Report Status 12/16/2014 FINAL  Final  Rapid strep screen     Status: None   Collection Time: 12/11/14 10:49 AM  Result Value Ref Range Status   Streptococcus, Group A Screen (Direct) NEGATIVE NEGATIVE Final    Comment: (NOTE) A Rapid Antigen test may result negative if the antigen level in the sample is below the detection level of this test. The FDA has not cleared this test as a stand-alone test therefore the rapid antigen negative result has reflexed to a Group A Strep culture.   Culture, Group A Strep     Status: Abnormal   Collection Time: 12/11/14 10:49 AM  Result Value Ref Range Status   Strep A Culture Comment (A)  Final    Comment: (NOTE) Beta-hemolytic colonies, not group A Streptococcus isolated. Performed At: [facility]  [address] [place], [place] [id number] Mila Homer MD VH:[id number]      Labs: Basic Metabolic Panel:  Recent Labs Lab 12/11/14 1037 12/12/14 1518 12/12/14 1723 12/13/14 0420 12/14/14 0414 12/15/14 0413  NA 136 137 137 136 136 139  K 4.4 4.0 3.9 3.9 4.2 4.6  CL 106 102 105 102 103 106  CO2 22  --  21* 22 21* 21*  GLUCOSE 116* 115* 94 93 140* 151*  BUN CREATININE 1.34* 1.50* 1.27* 1.24 1.13 0.99  CALCIUM 9.5  --  9.1 8.8* 9.2 9.5   Liver Function Tests:  Recent Labs Lab 12/11/14 1037 12/12/14 1723 12/13/14 0420 12/14/14 0414 12/15/14 0413  AST 77* 71* 43* 55* 40  ALT 98* 89* 71* 76* 76*  ALKPHOS 50 48 45 51 48  BILITOT 0.9 0.8 1.1 0.7 0.5  PROT 8.2* 7.0 6.8 7.3 7.3  ALBUMIN 4.5 3.7 3.3* 3.4* 3.6    Recent Labs Lab 12/11/14 1037  LIPASE 23   No results for input(s): AMMONIA in the last 168 hours. CBC:  Recent Labs Lab 12/11/14 1037 12/12/14 1518 12/12/14 1723 12/13/14 0420 12/14/14 0414 12/15/14 0413  WBC 10.7*  --  12.4* 9.3 12.0* 14.5*  NEUTROABS 7.9*   --  10.0* 6.6 10.6* 12.5*  HGB 16.4 18.4* 14.6 13.9 14.1 14.0  HCT 47.5 54.0* 43.2 41.2 41.1 41.1  MCV 84.5  --  85.4 85.8 83.4 85.3  PLT 351  --  298 298 344 394   Cardiac Enzymes: No results for input(s): CKTOTAL, CKMB, CKMBINDEX, TROPONINI in the last 168 hours. BNP: BNP (last 3 results) No results for input(s): BNP in the last 8760 hours.  ProBNP (last 3 results) No results for input(s): PROBNP in the last 8760 hours.  CBG: No results for input(s): GLUCAP in the last 168 hours.     SignedZannie Cove  [facility] 12/16/2014, 2:54 PM

## 2014-12-19 ENCOUNTER — Emergency Department (HOSPITAL_COMMUNITY): Payer: Medicaid Other

## 2014-12-19 ENCOUNTER — Emergency Department (HOSPITAL_COMMUNITY)
Admission: EM | Admit: 2014-12-19 | Discharge: 2014-12-19 | Disposition: A | Discharge status: Home / Self Care | Payer: Medicaid Other | Attending: Emergency Medicine | Admitting: Emergency Medicine

## 2014-12-19 ENCOUNTER — Encounter (HOSPITAL_COMMUNITY): Payer: Self-pay

## 2014-12-19 DIAGNOSIS — R042 Hemoptysis: Secondary | ICD-10-CM | POA: Diagnosis present

## 2014-12-19 DIAGNOSIS — K5900 Constipation, unspecified: Secondary | ICD-10-CM | POA: Diagnosis not present

## 2014-12-19 DIAGNOSIS — R52 Pain, unspecified: Secondary | ICD-10-CM

## 2014-12-19 DIAGNOSIS — F17200 Nicotine dependence, unspecified, uncomplicated: Secondary | ICD-10-CM | POA: Insufficient documentation

## 2014-12-19 DIAGNOSIS — K1379 Other lesions of oral mucosa: Secondary | ICD-10-CM | POA: Diagnosis not present

## 2014-12-19 DIAGNOSIS — Z79899 Other long term (current) drug therapy: Secondary | ICD-10-CM | POA: Diagnosis not present

## 2014-12-19 LAB — LIPASE, BLOOD: LIPASE: 24 U/L (ref 11–51)

## 2014-12-19 LAB — COMPREHENSIVE METABOLIC PANEL
ALBUMIN: 4 g/dL (ref 3.5–5.0)
ALT: 43 U/L (ref 17–63)
AST: 23 U/L (ref 15–41)
Alkaline Phosphatase: 38 U/L (ref 38–126)
Anion gap: 12 (ref 5–15)
BUN: 26 mg/dL — AB (ref 6–20)
CHLORIDE: 103 mmol/L (ref 101–111)
CO2: 22 mmol/L (ref 22–32)
CREATININE: 1.33 mg/dL — AB (ref 0.61–1.24)
Calcium: 10.1 mg/dL (ref 8.9–10.3)
GFR calc Af Amer: >60 mL/min (ref >60)
Glucose, Bld: 113 mg/dL — ABNORMAL HIGH (ref 65–99)
POTASSIUM: 4.8 mmol/L (ref 3.5–5.1)
SODIUM: 137 mmol/L (ref 135–145)
Total Bilirubin: 0.9 mg/dL (ref 0.3–1.2)
Total Protein: 7.9 g/dL (ref 6.5–8.1)

## 2014-12-19 LAB — CBC WITH DIFFERENTIAL/PLATELET
Basophils Absolute: 0 10*3/uL (ref 0.0–0.1)
Basophils Relative: 0 %
EOS PCT: 0 %
Eosinophils Absolute: 0 10*3/uL (ref 0.0–0.7)
HCT: 50.7 % (ref 39.0–52.0)
Hemoglobin: 17.9 g/dL — ABNORMAL HIGH (ref 13.0–17.0)
LYMPHS ABS: 2.5 10*3/uL (ref 0.7–4.0)
Lymphocytes Relative: 19 %
MCH: 29.6 pg (ref 26.0–34.0)
MCHC: 35.3 g/dL (ref 30.0–36.0)
MCV: 83.9 fL (ref 78.0–100.0)
MONOS PCT: 7 %
Monocytes Absolute: 0.9 10*3/uL (ref 0.1–1.0)
NEUTROS ABS: 9.9 10*3/uL — AB (ref 1.7–7.7)
Neutrophils Relative %: 74 %
PLATELETS: ADEQUATE 10*3/uL (ref 150–400)
RBC: 6.04 MIL/uL — ABNORMAL HIGH (ref 4.22–5.81)
RDW: 12.3 % (ref 11.5–15.5)
WBC: 13.3 10*3/uL — ABNORMAL HIGH (ref 4.0–10.5)

## 2014-12-19 MED ORDER — SODIUM CHLORIDE 0.9 % IV BOLUS (SEPSIS)
1000.0000 mL | Freq: Once | INTRAVENOUS | Status: AC
  Administered 2014-12-19: 1000 mL via INTRAVENOUS

## 2014-12-19 MED ORDER — LIDOCAINE VISCOUS 2 % MT SOLN: 15.0000 mL | OROMUCOSAL | Status: AC | PRN

## 2014-12-19 MED ORDER — IOHEXOL 350 MG/ML SOLN
100.0000 mL | Freq: Once | INTRAVENOUS | Status: AC | PRN
  Administered 2014-12-19: 100 mL via INTRAVENOUS

## 2014-12-19 MED ORDER — OXYCODONE-ACETAMINOPHEN 5-325 MG PO TABS: 1.0000 | ORAL_TABLET | ORAL | Status: AC | PRN

## 2014-12-19 MED ORDER — POLYETHYLENE GLYCOL 3350 17 G PO PACK: 17.0000 g | PACK | Freq: Two times a day (BID) | ORAL | Status: AC | PRN

## 2014-12-19 NOTE — Progress Notes (Addendum)
Pt with 3 ED visits and 1 admission in the last 6 months He has been in ED 2 times since 11/15/14 d/c  Cm assessed pt who confirms he is not having trouble with getting his medications  Reports issues with swallowing medication States prednisone is the newest medication provided at d/c Cm discussed having EDP to evaluate med and pt crushing medication if possible to make easier to swallow  Pt was unaware the his pcp is listed as [facility] nor his access to medicaid transportation . Pt states no one "told me about any of this"  Pt states "I live off [place]" CM explained medicaid [place] access coverage, access to [employer] service and how to contact Infectious disease provider to request an earlier appt Provided [employer] application and information on [place] access to read  Discussed EDp vs pcp and Infectious disease providers responsibility and types of care received from each   Pt reports new areas (dark sores) found on his skin since his d/c date  Entered info in d/c and pt given a copy of this     Follow-up With Details Why Contact Info  [facility], [facility] Schedule an appointment as soon as possible for a visit on 12/23/2014 This is your assigned Sandi Mariscalmedicaid [place] access doctor If you prefer another contact DSS [phone] [place] [address] [phone]   Daiva EvesVan Dam, Lisette GrinderCornelius N Call on 12/23/2014 You hav an appointment scheduled on 01/01/15 BUT if you need to be seen earlier CALL to ask IF you can be seen EARLIER  [address] [place] [address] [phone]   medicaid transportation  On 12/19/2014 As a Medicaid client you MUST contact them each time you change address, move to another county or another state to keep your address updated Medicaid Transportation assists you to your dr appointments: [phone] or [phone] Transportation Supervisor [phone]  You must call by noon 1-3 days  before  [employer]: [place]: [phone] (main) [email or web address] [address] [place], [place] [address]

## 2014-12-19 NOTE — ED Provider Notes (Signed)
CSN: [id number]     Arrival date & time 12/19/14  1500 History   First MD Initiated Contact with Patient 12/19/14 1655     Chief Complaint  Patient presents with  . Hemoptysis  . 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  Last BM was 12/16/14 was watery and dark green.  No BM since, is passing flatus.  He is SOB and has "loud breathing" at night, improved during the day.  Has chest pain intermittently, mostly with lying supine, feels sharp, 10/10 intensity in his anterior chest.  Has had decreased PO intake.  States his oral and genital lesions are unchanged.    History reviewed. No pertinent past medical history. History reviewed. No pertinent past surgical history. History reviewed. No pertinent family history. Social History  Substance Use Topics  . Smoking status: Current Every Day Smoker  . Smokeless tobacco: None  . Alcohol Use: Yes    Review of Systems  All other systems reviewed and are negative.     Allergies  Review of patient's allergies indicates no known allergies.  Home Medications   Prior to Admission medications   Medication Sig Start Date End Date Taking? Authorizing Provider  albuterol (PROVENTIL HFA;VENTOLIN HFA) 108 (90 BASE) MCG/ACT inhaler Inhale 1 puff into the lungs every 6 (six) hours as needed for wheezing or shortness of breath.   Yes Historical Provider, MD  HYDROcodone-acetaminophen (NORCO/VICODIN) 5-325 MG tablet Take 1 tablet by mouth every 6 (six) hours as needed for severe pain. 12/16/14  Yes Zannie CovePreetha Joseph, MD  ibuprofen (ADVIL,MOTRIN) 800 MG tablet Take 1 tablet (800 mg total) by mouth every 6 (six) hours as  needed. 12/16/14  Yes Zannie CovePreetha Joseph, MD  lidocaine (XYLOCAINE) 2 % solution Use as directed 15 mLs in the mouth or throat every 4 (four) hours as needed for mouth pain. 12/16/14  Yes Zannie CovePreetha Joseph, MD  Menthol (HALLS COUGH DROPS MT) Use as directed 1 drop in the mouth or throat as needed (soreness).   Yes Historical Provider, MD  predniSONE (DELTASONE) 20 MG tablet Take 40mg  for 3days then 20mg  for 3days then 10mg  for 3days then STOP 12/16/14  Yes Zannie CovePreetha Joseph, MD  promethazine (PHENERGAN) 25 MG tablet Take 1 tablet (25 mg total) by mouth every 6 (six) hours as needed for nausea or vomiting. 12/11/14  Yes Arby BarretteMarcy Pfeiffer, MD   BP 116/74 mmHg  Pulse 93  Temp(Src) 97.5 F (36.4 C) (Oral)  Resp 16  SpO2 98% Physical Exam  Constitutional: He appears well-developed and well-nourished. No distress.  HENT:  Head: Normocephalic and atraumatic.  Neck: Neck supple.  Cardiovascular: Normal rate and regular rhythm.   Pulmonary/Chest: Effort normal and breath sounds normal. No respiratory distress. He has no wheezes. He has no rales.  Abdominal: Soft. He exhibits no distension and no mass. There is tenderness in the right upper quadrant. There is no rebound and no guarding.  Neurological: He is alert. He exhibits normal muscle tone.  Skin: He is not diaphoretic.  Multiple ulcerative lesions of lips, oral mucosa.  Few scattered ulcers of skin throughout body.  Mild injection bilateral conjunctiva, no active discharge.    Nursing note and vitals reviewed.   ED Course  Procedures (including critical care time) Labs  Review Labs Reviewed  CBC WITH DIFFERENTIAL/PLATELET - Abnormal; Notable for the following:    WBC 13.3 (*)    RBC 6.04 (*)    Hemoglobin 17.9 (*)    Neutro Abs 9.9 (*)    All other components within normal limits  COMPREHENSIVE METABOLIC PANEL - Abnormal; Notable for the following:    Glucose, Bld 113 (*)    BUN 26 (*)    Creatinine, Ser 1.33 (*)    All other components within  normal limits  LIPASE, BLOOD    Imaging Review Dg Chest 2 View  12/19/2014  CLINICAL DATA:  Generalized pain.  Coughing up blood. EXAM: CHEST  2 VIEW COMPARISON:  12/12/2014; 12/11/2014 FINDINGS: Grossly unchanged cardiac silhouette and mediastinal contours. No focal parenchymal opacities. No pleural effusion or pneumothorax. No evidence of edema. No acute osseus abnormalities. IMPRESSION: No acute cardiopulmonary disease. Specifically, no evidence of pneumonia. Electronically Signed   By: Simonne Come M.D.   On: 12/19/2014 17:58   Ct Angio Chest Pe W/cm &/or Wo Cm  12/19/2014  CLINICAL DATA:  Hemoptysis today.  History of Behcet's disease EXAM: CT ANGIOGRAPHY CHEST WITH CONTRAST TECHNIQUE: Multidetector CT imaging of the chest was performed using the standard protocol during bolus administration of intravenous contrast. Multiplanar CT image reconstructions and MIPs were obtained to evaluate the vascular anatomy. CONTRAST:  OMNIPAQUE IOHEXOL 350 MG/ML SOLN COMPARISON:  Chest x-ray 12/19/2014 FINDINGS: Mediastinum/Nodes: No chest wall mass, supraclavicular or axillary adenopathy. The thyroid gland is grossly normal. The heart is normal in size. No pericardial effusion. The aorta is normal in caliber. No dissection. The branch vessels are patent. No mediastinal or hilar mass or adenopathy. Small scattered lymph nodes are noted. The esophagus is grossly normal. The pulmonary arterial tree is well opacified. No filling defects to suggest pulmonary embolism. Lungs/Pleura: No acute pulmonary findings. No pleural effusion. No bronchiectasis or interstitial lung disease. No evidence of pulmonary hemorrhage. No worrisome pulmonary lesions. Upper abdomen: Diffuse fatty infiltration of the liver is noted. Musculoskeletal: No significant bony findings. Review of the MIP images confirms the above findings. IMPRESSION: No CT findings for pulmonary embolism. Normal thoracic aorta. No acute pulmonary findings.  Electronically Signed   By: Rudie Meyer M.D.   On: 12/19/2014 20:40   US Abdomen Limited Ruq  12/19/2014  CLINICAL DATA:  Pain.  No other history provided. EXAM: US ABDOMEN LIMITED - RIGHT UPPER QUADRANT COMPARISON:  None. FINDINGS: Gallbladder: No gallstones or wall thickening visualized. A layering echogenic sludge versus reverberation artifact. No sonographic Murphy sign noted. Common bile duct: Diameter: 2.6 mm Liver: There is diffuse increased echogenicity of the liver and decreased through transmission consistent with fatty infiltration. No focal lesions or biliary dilatation. IMPRESSION: Probable reverberation artifact affecting the gallbladder. Possible layering echogenic sludge but no gallstones or findings for acute cholecystitis. Diffuse fatty infiltration of the liver. Electronically Signed   By: Rudie Meyer M.D.   On: 12/19/2014 18:44      EKG Interpretation None       5:56 PM Discussed pt with Dr Judd Lien, recommends CT angio chest.    9:04 PM Repeat abdominal exam is benign.  Mild tenderness in LUQ vs periumbilical area.  No guarding, no rebound.  Pt declines rectal exam.   MDM   Final diagnoses:  Pain  Hemoptysis  Constipation, unspecified constipation type   Afebrile nontoxic patient with recent diagnosis of Behcets p/w SOB with lying flat, hemoptysis this morning, genital pain, constipation.  Labs significant for  renal insufficiency, leukocytosis (pt on prednisone), elevated Hgb (likely mild dehydration).  Pt with RUQ tenderness - US abdomen without acute findings.  Repeat abdominal exam with no tenderness in RUQ, now with very slight tenderness in LUQ and periumbilical areas.  CXR negative.  CT angio chest negative.  Suspect pt is having continued pain, somewhat decreased PO due to stable oral lesions.  Also suspect pt is having constipation due to norco usage without stool softeners.  Abdominal exam is nonsurgical.  CT angio shows no pulmonary lesions - suspect the blood  he coughed up this morning is related to bleeding from ulcerative lesions of the mouth.  No airway concerns.  D/C home with percocet (pt nearly out of norco), miralax, refill of viscous lidocaine for mouth pain.  PCP and ID follow up as before.  Discussed result, findings, treatment, and follow up  with patient.  Pt given return precautions.  Pt verbalizes understanding and agrees with plan.       Trixie Dredge, PA-C 12/19/14 1191  Geoffery Lyons, MD 12/23/14 [phone]

## 2014-12-19 NOTE — Discharge Instructions (Signed)
Read the information below.  Use the prescribed medication as directed.  Please discuss all new medications with your pharmacist.  Do not take additional tylenol while taking the prescribed pain medication to avoid overdose.  You may return to the Emergency Department at any time for worsening condition or any new symptoms that concern you.   If you develop high fevers, worsening abdominal pain, uncontrolled vomiting, or are unable to tolerate fluids by mouth, return to the ER for a recheck.     Constipation, Adult Constipation is when a person:  Poops (has a bowel movement) less than 3 times a week.  Has a hard time pooping.  Has poop that is dry, hard, or bigger than normal. HOME CARE   Eat foods with a lot of fiber in them. This includes fruits, vegetables, beans, and whole grains such as Lordi rice.  Avoid fatty foods and foods with a lot of sugar. This includes french fries, hamburgers, cookies, candy, and soda.  If you are not getting enough fiber from food, take products with added fiber in them (supplements).  Drink enough fluid to keep your pee (urine) clear or pale yellow.  Exercise on a regular basis, or as told by your doctor.  Go to the restroom when you feel like you need to poop. Do not hold it.  Only take medicine as told by your doctor. Do not take medicines that help you poop (laxatives) without talking to your doctor first. GET HELP RIGHT AWAY IF:   You have bright red blood in your poop (stool).  Your constipation lasts more than 4 days or gets worse.  You have belly (abdominal) or butt (rectal) pain.  You have thin poop (as thin as a pencil).  You lose weight, and it cannot be explained. MAKE SURE YOU:   Understand these instructions.  Will watch your condition.  Will get help right away if you are not doing well or get worse.   This information is not intended to replace advice given to you by your health care provider. Make sure you discuss any  questions you have with your health care provider.   Document Released: 07/14/2007 Document Revised: 02/15/2014 Document Reviewed: 11/06/2012 Elsevier Interactive Patient Education 2016 Elsevier Inc.  Hemoptysis Hemoptysis means coughing up blood. The blood may come from the lungs and airways. It can also come from bleeding that occurs outside the lungs and airways. Coughing up blood can be a sign of a minor problem or a serious medical condition.  HOME CARE  Only take medicine as told by your doctor. Do not use medicines that help you stop coughing (cough suppressants) unless your doctor approves.  If you are given antibiotic medicine, take it as told. Finish it even if you start to feel better.  Do not smoke. Also avoid being around others when they are smoking.  Follow up with your doctor as told. GET HELP RIGHT AWAY IF:  You cough up bloody spit (mucus) for longer than a week.  You have a blood-producing cough that is severe or getting worse.  You have a blood-producing cough thatcomes and goes over time.  You have trouble breathing.   You throw up (vomit) blood.  You have bloody or black poop (stool).  You have chest pain.   You have night sweats.  You feel faint or pass out.   You have a fever or lasting symptoms for more than 2-3 days.  You have a fever and your symptoms suddenly get  worse. MAKE SURE YOU:  Understand these instructions.  Will watch your condition.  Will get help right away if you are not doing well or get worse.   This information is not intended to replace advice given to you by your health care provider. Make sure you discuss any questions you have with your health care provider.   Document Released: 01/12/2012 Document Reviewed: 01/12/2012 Elsevier Interactive Patient Education [employer]    Emergency Department Resource Guide 1) Find a Doctor and Pay Out of Pocket Although you won't have to find out who is covered by  your insurance plan, it is a good idea to ask around and get recommendations. You will then need to call the office and see if the doctor you have chosen will accept you as a new patient and what types of options they offer for patients who are self-pay. Some doctors offer discounts or will set up payment plans for their patients who do not have insurance, but you will need to ask so you aren't surprised when you get to your appointment.  2) Contact Your Local Health Department Not all health departments have doctors that can see patients for sick visits, but many do, so it is worth a call to see if yours does. If you don't know where your local health department is, you can check in your phone book. The CDC also has a tool to help you locate your state's health department, and many state websites also have listings of all of their local health departments.  3) Find a Walk-in Clinic If your illness is not likely to be very severe or complicated, you may want to try a walk in clinic. These are popping up all over the country in pharmacies, drugstores, and shopping centers. They're usually staffed by nurse practitioners or physician assistants that have been trained to treat common illnesses and complaints. They're usually fairly quick and inexpensive. However, if you have serious medical issues or chronic medical problems, these are probably not your best option.  No Primary Care Doctor: - Call Health Connect at  [phone] - they can help you locate a primary care doctor that  accepts your insurance, provides certain services, etc. - Physician Referral Service- [phone]  Chronic Pain Problems: Organization         Address  Phone   Notes  [facility]  [phone] Patients need to be referred by their primary care doctor.   Medication Assistance: Organization         Address  Phone   Notes  [facility] Medication [facility] [address] [place] [place], [place] [address] [phone] --Must be a resident of [facility] -- Must have NO insurance coverage whatsoever (no Medicaid/ Medicare, etc.) -- The pt. MUST have a primary care doctor that directs their care regularly and follows them in the community   [facility]ist  [phone]   [employer]  [phone]    Agencies that provide inexpensive medical care: Organization         Address  Phone   Notes  [facility]  [phone]   [facility]    [phone]   [facility] [address] [place], [place] [address] [phone]   [facility] [address] [place] [place] [phone]   Planned Parenthood    [phone]   [facility]    [phone]  [facility]  [address], [place] Phone:  [phone](336) [phone], Fax:  [phone]) [phone] Hours of Operation:  9 am - 6 pm, M-F.  Also accepts Medicaid/Medicare and self-pay.  [facility]  [address], [place] Phone: [phone]) [phone], Fax: [phone]. Hours of Operation:  8:30 am - 5:30 pm, M-F.  Also accepts Medicaid and self-pay.  [facility] [place] [address], [place] Phone: [phone]) [phone]   [facility] [address], [place], [place] [phone], Ext. [phone] Mondays & Thursdays: 7-9 AM.  First 15 patients are seen on a first come, first serve basis.    Medicaid-accepting [facility] Providers:  Organization         Address  Phone   Notes  [facility] [address], [place] [phone] Also accepts self-pay patients.  [facility] [address], [place]  [phone]) [phone]   [facility] [address], [place] [phone]   [facility] [address] [place] [address], [place] [phone]) [phone]   Renaye RakersVeita Bland [address]  [address], [place]   [phone]) [phone] Only accepts [place] Access [place] patients after they have their name applied to their card.   Self-Pay (no insurance) in [facility]:  Organization         Address  Phone   Notes  Sickle Cell Patients, [facility] [address], [place] [phone]) [phone]   [facility] [address], [place] [phone]) [phone]   [facility]  [address], [address],  [phone]) [phone]   [facility]/Dr. Osei-Bonsu  [address] [place] [address], [place] or [phone], [place] [phone](336) [phone] Phone number for both [place] and [place] locations is the same.  [facility] [address], [place] [phone]) [phone]   [facility] [place] [address] [place] [address], [place] or [address] [phone]) [phone]   [facility] [address], [place] [phone]) [phone], phone; [phone](336) [phone], fax Sees patients 1st and 3rd Saturday of every month.  Must not qualify for public or private insurance (i.e. Medicaid, Medicare, [place] Health Choice, Veterans' Benefits)  Household income should be no more than 200% of the poverty level The clinic cannot treat you if you are pregnant or think you are pregnant  Sexually transmitted diseases are not treated at the clinic.    Dental Care: Organization         Address  Phone  Notes  [facility] [address], [place] [phone]) [phone] Accepts children up to age 25 who are enrolled in [place]icaid or [place] Health Choice; pregnant women with a Medicaid card; and children who have applied for Medicaid or [place] Health Choice, but were declined, whose parents can pay a reduced fee at time of service.  [facility] [place]  [address], [place] [phone]) [phone] Accepts children up to age 25 who are enrolled  in [place]icaid or [place] Health Choice; pregnant women with a Medicaid card; and children who have applied for Medicaid or [place] Health Choice, but were declined, whose parents can pay a reduced fee at time of service.  [facility]  [phone]  [address], [place] [phone]) [phone] Patients are seen by appointment only. Walk-ins are not accepted. [facility] will see patients 25 years of age and older. Monday - Tuesday (8am-5pm) Most Wednesdays (8:30-5pm) $30 per visit, cash only  [facility]  [address], [facility] [place] [phone]) [phone] Patients are seen by appointment only. Walk-ins are not accepted. [facility] will see patients 25 years of age and older. One Wednesday Evening (Monthly: Volunteer Based).  $30 per visit, cash only  [employer] [facility]  [phone] for adults; Children under age 44, call Graduate Pediatric Dentistry at [phone]. Children aged 584-14, please call [phone] to request a pediatric application.  Dental services are provided in all areas of dental care including fillings, crowns and bridges, complete and partial dentures, implants, gum treatment, root canals, and extractions. Preventive care is also provided. Treatment is provided to both adults and children. Patients are selected via a lottery and there is often a waiting list.   [facility] [address], [place]  [phone]) [phone] [email or web address]   [facility] [address], [place], [place] [phone], Ext. [phone] Second and Fourth Thursday of each month, opens at 6:30 AM; Clinic ends at 9 AM.  Patients are seen on a first-come first-served basis, and a limited number are seen during each clinic.   [facility]  [address], [place], [place] [phone]) [phone]   Eligibility Requirements You must have lived in [place], [place], or [place] counties for at least the last three months.   You cannot be  eligible for state or federal sponsored National Cityhealthcare insurance, including CIGNAVeterans Administration, [place]icaid, or [employer] EntertainmentMedicare.   You generally cannot be eligible for healthcare insurance through your employer.    How to apply: Eligibility screenings are held every Tuesday and Wednesday afternoon from 1:00 pm until 4:00 pm. You do not need an appointment for the interview!  [facility] [address], [place], [place] [phone]   [facility]  [phone]   [facility]  [phone]   [facility]  [phone]    Behavioral Health Resources in the Community: Intensive Outpatient Programs Organization         Address  Phone  Notes  [facility] [address], [place], [place] [phone]   [facility] [address], [place], [place] [phone]   [facility] [address], [place], [place]  [phone]   [facility] [address],  [place], [place] [phone] or [phone]   Substance Abuse Resources Organization         Address  Phone  Notes  [facility]  [phone]   [facility]  [phone]   [facility]  [phone]   [facility]  [phone]   Residential & Outpatient Substance Abuse Program  [phone]   Psychological Services Organization         Address  Phone  Notes  [facility]  [phone]- [phone]   [facility]  [phone]- [phone]   [facility] [address], [place] [phone] or [phone]    Mobile Crisis Teams Organization         Address  Phone  Notes  [facility], Mobile Crisis Care Unit  [phone]   [facility]  [address] [place], [place] [phone]   Jasmine DecemberSharon  DeEsch [address] [place] [phone]    Self-Help/Support Groups Organization          Address  Phone             Notes  [facility] [place] - variety of support groups  [phone]- I7437963 Call for more information  Narcotics Anonymous (NA), [facility] [address], [employer] [place]  2 meetings at this location   Statistician         Address  Phone  Notes  [facility] [address],    [place]  [phone]   [facility]  [address], [place] [address], [place], [place] [phone]   [facility] [address] [place], [place] [phone] Admissions: 8am-3pm M-F  [facility] [address],    [employer], [place] [phone]   [facility] [address] [place], [place], [place] [phone]   [facility] [address],  [place], [place] [phone]   [facility] - Intensive Outpatient [address], [place], [place] [phone]   [facility] [address] [place]  [place], [place] [phone] or [phone]   [facility] ([facility]) [address] [place], [place] [phone] Accepts Medicaid  [facility] [address],  [place] [phone] Substance Abuse/Addiction Treatment   [facility] Organization         Address  Phone  Notes  [facility]  [phone]   Angie Fava, PhD [address] [place], [place]   [phone] or [phone]   [facility]al   [address] [place], [place] [phone]   [facility] [address] [place], [place] [phone] Insurance/Medicaid/sponsorship through [facility] [address]                                    [place], [place] [phone] Therapy/tele-psych/case  [facility] [address], [place] [phone]    Dr. Lolly Mustache  [phone]   [facility]  [employer]  [facility] Dept. 1) [address], [place] 2) [address], [place] 3)  [address], [place] [phone]  [phone]   [facility] Child Abuse Hotline [phone] or [phone] (After Hours)

## 2014-12-19 NOTE — ED Notes (Signed)
Pt to CT

## 2014-12-19 NOTE — ED Notes (Addendum)
Pt states he has been seen 3 times since 11/2.  Pt has lesions over entire body.  Mouth is scabbed and bleeding.  Coughing up blood with increase today.  Lesions to private area.  Generalized pain all over.

## 2015-01-01 ENCOUNTER — Inpatient Hospital Stay: Payer: Medicaid Other | Admitting: Internal Medicine

## 2015-01-14 ENCOUNTER — Inpatient Hospital Stay: Payer: Medicaid Other | Admitting: Internal Medicine

## 2015-11-02 ENCOUNTER — Encounter (HOSPITAL_COMMUNITY): Payer: Self-pay | Admitting: Emergency Medicine

## 2015-11-02 ENCOUNTER — Emergency Department (HOSPITAL_COMMUNITY)
Admission: EM | Admit: 2015-11-02 | Discharge: 2015-11-02 | Disposition: A | Discharge status: Home / Self Care | Payer: Medicaid Other | Attending: Emergency Medicine | Admitting: Emergency Medicine

## 2015-11-02 DIAGNOSIS — F17200 Nicotine dependence, unspecified, uncomplicated: Secondary | ICD-10-CM | POA: Insufficient documentation

## 2015-11-02 DIAGNOSIS — Z711 Person with feared health complaint in whom no diagnosis is made: Secondary | ICD-10-CM

## 2015-11-02 DIAGNOSIS — R369 Urethral discharge, unspecified: Secondary | ICD-10-CM | POA: Diagnosis not present

## 2015-11-02 MED ORDER — STERILE WATER FOR INJECTION IJ SOLN
INTRAMUSCULAR | Status: AC
  Filled 2015-11-02: qty 10

## 2015-11-02 MED ORDER — AZITHROMYCIN 250 MG PO TABS
1000.0000 mg | ORAL_TABLET | Freq: Once | ORAL | Status: AC
  Administered 2015-11-02: 1000 mg via ORAL
  Filled 2015-11-02: qty 4

## 2015-11-02 MED ORDER — METRONIDAZOLE 500 MG PO TABS: 500.0000 mg | ORAL_TABLET | Freq: Two times a day (BID) | ORAL | 0 refills | Status: AC

## 2015-11-02 MED ORDER — CEFTRIAXONE SODIUM 250 MG IJ SOLR
250.0000 mg | Freq: Once | INTRAMUSCULAR | Status: AC
  Administered 2015-11-02: 250 mg via INTRAMUSCULAR
  Filled 2015-11-02: qty 250

## 2015-11-02 NOTE — ED Triage Notes (Signed)
Sharp pain in shaft and head of penis; clear/white discharge intermittently. Started 3 days ago. Endorses unprotected sex.

## 2015-11-02 NOTE — ED Provider Notes (Signed)
[facility]-EMERGENCY DEPT Provider Note   CSN: [id number] Arrival date & time: 11/02/15  4098  By signing my name below, I, [name], attest that this documentation has been prepared under the direction and in the presence of [name], PA-C Electronically Signed: [name], ED Scribe. 11/02/15. 10:03 PM.   History   Chief Complaint Chief Complaint  Patient presents with  . Penile Discharge    HPI Julian Reynolds is a 25 y.o. male who presents to the Emergency Department complaining of intermittent clear/white penile discharge onset 3 days ago. Pt states that he last had unprotected sex 3 days ago. Pt notes that he was informed by a sexual partner that he would need to be evaluated for trichomoniasis. Pt reports that he has had gonorrhea and chlamydia in the past. He states that he is having associated symptoms of generalized itching to genital area. He states that he has not tried any medications for the relief of his symptoms. He denies fever, abdominal pain, nausea, vomiting, mouth sores, lesions to buttocks, penile pain, penile swelling, testicular pain, testicular swelling,  and any other symptoms.     The history is provided by the patient. No language interpreter was used.    History reviewed. No pertinent past medical history.  Patient Active Problem List   Diagnosis Date Noted  . Other lesions of oral mucosa   . Behcet's disease (HCC)   . Bilateral conjunctivitis   . Oral ulcer   . Penile lesion   . High risk sexual behavior   . Tobacco abuse disorder 12/12/2014  . AKI (acute kidney injury) (HCC) 12/12/2014  . Abnormal LFTs 12/12/2014    History reviewed. No pertinent surgical history.     Home Medications    Prior to Admission medications   Medication Sig Start Date End Date Taking? Authorizing Provider  albuterol (PROVENTIL HFA;VENTOLIN HFA) 108 (90 BASE) MCG/ACT inhaler Inhale 1 puff into the lungs every 6 (six) hours as needed for wheezing or shortness of  breath.    Historical Provider, MD  HYDROcodone-acetaminophen (NORCO/VICODIN) 5-325 MG tablet Take 1 tablet by mouth every 6 (six) hours as needed for severe pain. 12/16/14   Zannie Cove, MD  ibuprofen (ADVIL,MOTRIN) 800 MG tablet Take 1 tablet (800 mg total) by mouth every 6 (six) hours as needed. 12/16/14   Zannie Cove, MD  lidocaine (XYLOCAINE) 2 % solution Use as directed 15 mLs in the mouth or throat every 4 (four) hours as needed for mouth pain. Swish and spit 12/19/14   Trixie Dredge, PA-C  Menthol (HALLS COUGH DROPS MT) Use as directed 1 drop in the mouth or throat as needed (soreness).    Historical Provider, MD  metroNIDAZOLE (FLAGYL) 500 MG tablet Take 1 tablet (500 mg total) by mouth 2 (two) times daily. 11/02/15   Everlene Farrier, PA-C  oxyCODONE-acetaminophen (PERCOCET/ROXICET) 5-325 MG tablet Take 1-2 tablets by mouth every 4 (four) hours as needed for moderate pain or severe pain. 12/19/14   Trixie Dredge, PA-C  polyethylene glycol (MIRALAX / GLYCOLAX) packet Take 17 g by mouth 2 (two) times daily as needed for mild constipation or moderate constipation. 12/19/14   Trixie Dredge, PA-C  predniSONE (DELTASONE) 20 MG tablet Take 40mg  for 3days then 20mg  for 3days then 10mg  for 3days then STOP 12/16/14   Zannie Cove, MD  promethazine (PHENERGAN) 25 MG tablet Take 1 tablet (25 mg total) by mouth every 6 (six) hours as needed for nausea or vomiting. 12/11/14   Arby Barrette, MD  Family History History reviewed. No pertinent family history.  Social History Social History  Substance Use Topics  . Smoking status: Current Every Day Smoker  . Smokeless tobacco: Never Used  . Alcohol use Yes     Allergies   Review of patient's allergies indicates no known allergies.   Review of Systems Review of Systems  Constitutional: Negative for fever.  HENT: Negative for mouth sores.   Gastrointestinal: Negative for abdominal pain, nausea and vomiting.  Genitourinary: Positive for discharge.  Negative for decreased urine volume, difficulty urinating, dysuria, frequency, genital sores, hematuria, penile pain, penile swelling, scrotal swelling, testicular pain and urgency.       Generalized itching to genital area  Skin: Negative for color change and rash.     Physical Exam Updated Vital Signs BP (!) 140/103 (BP Location: Right Arm)   Pulse 92   Temp 99.1 F (37.3 C) (Oral)   Resp 16   Ht 5\' 9"  (1.753 m)   Wt 101.4 kg   SpO2 97%   BMI 33.02 kg/m   Physical Exam  Constitutional: He appears well-developed and well-nourished. No distress.  HENT:  Head: Normocephalic and atraumatic.  Mouth/Throat: Oropharynx is clear and moist.  Eyes: Right eye exhibits no discharge. Left eye exhibits no discharge.  Pulmonary/Chest: Effort normal. No respiratory distress.  Abdominal: Soft. He exhibits no distension. There is no tenderness.  Genitourinary: Testes normal and penis normal. Right testis shows no tenderness. Left testis shows no tenderness. Circumcised. No penile tenderness. No discharge found.  Genitourinary Comments: Scribe chaperone present for exam. No rashes or lesions noted. No penile discharge. No testicular tenderness. No penile tenderness.   Neurological: He is alert. Coordination normal.  Skin: Skin is warm and dry. Capillary refill takes less than 2 seconds. No rash noted. He is not diaphoretic. No erythema. No pallor.  Psychiatric: He has a normal mood and affect. His behavior is normal.  Nursing note and vitals reviewed.    ED Treatments / Results  DIAGNOSTIC STUDIES: Oxygen Saturation is 97% on RA, nl by my interpretation.    COORDINATION OF CARE: 10:01 PM Discussed treatment plan with pt at bedside which includes GC/Chlamydia probe, HIV antibody, RPR, rocephin, zithromax, and pt agreed to plan.   Labs (all labs ordered are listed, but only abnormal results are displayed) Labs Reviewed  RPR  HIV ANTIBODY (ROUTINE TESTING)  GC/CHLAMYDIA PROBE AMP ([facility]) NOT AT [facility]     Procedures Procedures (including critical care time)  Medications Ordered in ED Medications  cefTRIAXone (ROCEPHIN) injection 250 mg (not administered)  azithromycin (ZITHROMAX) tablet 1,000 mg (not administered)  sterile water (preservative free) injection (not administered)     Initial Impression / Assessment and Plan / ED Course  I have reviewed the triage vital signs and the nursing notes.  Pertinent labs that were available during my care of the patient were reviewed by me and considered in my medical decision making (see chart for details).  Clinical Course     Patient presents to the ED with 3 days of penile discharge following unprotected sexual intercourse. He was told his partner might have trich. Patient treated prophylactically in the ED for STI with rocephin, zithromax. Flagyl at discharge. Patient advised to inform and treat all sexual partners.  Pt advised on safe sex practices and understands that they have GC/Chlamydia cultures pending and will result in 2-3 days. HIV and RPR sent and also advised these are pending. Pt encouraged to follow up at local  health department for future STI checks. No concern for prostatitis or epididymitis. Discussed return precautions. Pt appears safe for discharge. I advised the patient to follow-up with their primary care provider this week. I advised the patient to return to the emergency department with new or worsening symptoms or new concerns. The patient verbalized understanding and agreement with plan.    Final Clinical Impressions(s) / ED Diagnoses   Final diagnoses:  Penile discharge  Concern about STD in male without diagnosis    New Prescriptions New Prescriptions   METRONIDAZOLE (FLAGYL) 500 MG TABLET    Take 1 tablet (500 mg total) by mouth 2 (two) times daily.   I personally performed the services described in this documentation, which was scribed in my presence. The recorded information has been  reviewed and is accurate.       Everlene FarrierWilliam Atsushi Yom, PA-C 11/02/15 2215    Nira ConnPedro Eduardo Cardama, MD 11/03/15 [phone]

## 2015-11-02 NOTE — ED Notes (Signed)
Pt understood dc material and follow up information. NAD noted. Scripts given at dc 

## 2015-11-03 LAB — HIV ANTIBODY (ROUTINE TESTING W REFLEX): HIV Screen 4th Generation wRfx: NONREACTIVE

## 2015-11-03 LAB — GC/CHLAMYDIA PROBE AMP (~~LOC~~) NOT AT ARMC
CHLAMYDIA, DNA PROBE: NEGATIVE
Neisseria Gonorrhea: NEGATIVE

## 2015-11-03 LAB — RPR: RPR Ser Ql: NONREACTIVE

## 2015-11-10 ENCOUNTER — Encounter: Payer: Self-pay | Admitting: Internal Medicine

## 2015-11-11 ENCOUNTER — Telehealth (HOSPITAL_BASED_OUTPATIENT_CLINIC_OR_DEPARTMENT_OTHER): Payer: Self-pay | Admitting: Emergency Medicine

## 2016-08-12 ENCOUNTER — Emergency Department (HOSPITAL_COMMUNITY)
Admission: EM | Admit: 2016-08-12 | Discharge: 2016-08-12 | Disposition: A | Discharge status: Home / Self Care | Payer: Medicaid Other | Attending: Emergency Medicine | Admitting: Emergency Medicine

## 2016-08-12 ENCOUNTER — Emergency Department (HOSPITAL_COMMUNITY): Payer: Medicaid Other

## 2016-08-12 ENCOUNTER — Encounter (HOSPITAL_COMMUNITY): Payer: Self-pay | Admitting: Emergency Medicine

## 2016-08-12 DIAGNOSIS — R112 Nausea with vomiting, unspecified: Secondary | ICD-10-CM | POA: Insufficient documentation

## 2016-08-12 DIAGNOSIS — R1084 Generalized abdominal pain: Secondary | ICD-10-CM | POA: Diagnosis not present

## 2016-08-12 DIAGNOSIS — R197 Diarrhea, unspecified: Secondary | ICD-10-CM | POA: Diagnosis not present

## 2016-08-12 DIAGNOSIS — F17200 Nicotine dependence, unspecified, uncomplicated: Secondary | ICD-10-CM | POA: Insufficient documentation

## 2016-08-12 LAB — URINALYSIS, ROUTINE W REFLEX MICROSCOPIC
Bilirubin Urine: NEGATIVE
GLUCOSE, UA: NEGATIVE mg/dL
HGB URINE DIPSTICK: NEGATIVE
KETONES UR: NEGATIVE mg/dL
Leukocytes, UA: NEGATIVE
NITRITE: NEGATIVE
PROTEIN: 30 mg/dL — AB
Specific Gravity, Urine: 1.027 (ref 1.005–1.030)
pH: 7 (ref 5.0–8.0)

## 2016-08-12 LAB — COMPREHENSIVE METABOLIC PANEL
ALK PHOS: 44 U/L (ref 38–126)
ALT: 54 U/L (ref 17–63)
ANION GAP: 11 (ref 5–15)
AST: 40 U/L (ref 15–41)
Albumin: 4.3 g/dL (ref 3.5–5.0)
BILIRUBIN TOTAL: 1.1 mg/dL (ref 0.3–1.2)
BUN: 10 mg/dL (ref 6–20)
CALCIUM: 9.4 mg/dL (ref 8.9–10.3)
CO2: 22 mmol/L (ref 22–32)
Chloride: 103 mmol/L (ref 101–111)
Creatinine, Ser: 1.18 mg/dL (ref 0.61–1.24)
GFR calc non Af Amer: >60 mL/min (ref >60)
Glucose, Bld: 113 mg/dL — ABNORMAL HIGH (ref 65–99)
Potassium: 3.8 mmol/L (ref 3.5–5.1)
SODIUM: 136 mmol/L (ref 135–145)
TOTAL PROTEIN: 7 g/dL (ref 6.5–8.1)

## 2016-08-12 LAB — CBC
HCT: 44.5 % (ref 39.0–52.0)
HEMOGLOBIN: 15.1 g/dL (ref 13.0–17.0)
MCH: 28.8 pg (ref 26.0–34.0)
MCHC: 33.9 g/dL (ref 30.0–36.0)
MCV: 84.9 fL (ref 78.0–100.0)
Platelets: 416 10*3/uL — ABNORMAL HIGH (ref 150–400)
RBC: 5.24 MIL/uL (ref 4.22–5.81)
RDW: 12.4 % (ref 11.5–15.5)
WBC: 8.4 10*3/uL (ref 4.0–10.5)

## 2016-08-12 LAB — LIPASE, BLOOD: Lipase: 27 U/L (ref 11–51)

## 2016-08-12 MED ORDER — ONDANSETRON 4 MG PO TBDP: 4.0000 mg | ORAL_TABLET | Freq: Three times a day (TID) | ORAL | 0 refills | Status: AC | PRN

## 2016-08-12 MED ORDER — SODIUM CHLORIDE 0.9 % IV BOLUS (SEPSIS)
1000.0000 mL | Freq: Once | INTRAVENOUS | Status: AC
  Administered 2016-08-12: 1000 mL via INTRAVENOUS

## 2016-08-12 MED ORDER — KETOROLAC TROMETHAMINE 30 MG/ML IJ SOLN
30.0000 mg | Freq: Once | INTRAMUSCULAR | Status: AC
  Administered 2016-08-12: 30 mg via INTRAVENOUS
  Filled 2016-08-12: qty 1

## 2016-08-12 MED ORDER — IOPAMIDOL (ISOVUE-300) INJECTION 61%
INTRAVENOUS | Status: AC
  Administered 2016-08-12: 100 mL
  Filled 2016-08-12: qty 100

## 2016-08-12 NOTE — ED Triage Notes (Signed)
Pt states for the last 3 days he has had generalized abd pain and burning with urination. Pt also reports n/v and green BM's.

## 2016-08-12 NOTE — Discharge Instructions (Signed)
Today, your CT scan is normal, your labs were all within normal parameters.  She will given IV fluids and antiemetics at time of discharge.  You were able to tolerate fluids, even given a prescription for Zofran, which will help control any further nausea.  You've also been given a dietary outline for diarrhea

## 2016-08-12 NOTE — ED Notes (Signed)
Pt given sprite and crackers for PO challenge.  

## 2016-08-12 NOTE — ED Provider Notes (Signed)
[facility]PT Provider Note   CSN: [id number] Arrival date & time: 08/12/16  1619     History   Chief Complaint Chief Complaint  Patient presents with  . painful urination   . Nausea    HPI Julian Reynolds is a 26 y.o. male.  NVD X 3 days has been unable to tolerate any PO denies recent antibiotic use, no known ill contacts His only home therapy has been sleeping       History reviewed. No pertinent past medical history.  Patient Active Problem List   Diagnosis Date Noted  . Other lesions of oral mucosa   . Behcet's disease (HCC)   . Bilateral conjunctivitis   . Oral ulcer   . Penile lesion   . High risk sexual behavior   . Tobacco abuse disorder 12/12/2014  . AKI (acute kidney injury) (HCC) 12/12/2014  . Abnormal LFTs 12/12/2014    No past surgical history on file.     Home Medications    Prior to Admission medications   Medication Sig Start Date End Date Taking? Authorizing Provider  albuterol (PROVENTIL HFA;VENTOLIN HFA) 108 (90 BASE) MCG/ACT inhaler Inhale 1 puff into the lungs every 6 (six) hours as needed for wheezing or shortness of breath.    [provider]  HYDROcodone-acetaminophen (NORCO/VICODIN) 5-325 MG tablet Take 1 tablet by mouth every 6 (six) hours as needed for severe pain. Patient not taking: Reported on 08/12/2016 12/16/14   Zannie Cove, MD  ibuprofen (ADVIL,MOTRIN) 800 MG tablet Take 1 tablet (800 mg total) by mouth every 6 (six) hours as needed. Patient not taking: Reported on 08/12/2016 12/16/14   Zannie Cove, MD  lidocaine (XYLOCAINE) 2 % solution Use as directed 15 mLs in the mouth or throat every 4 (four) hours as needed for mouth pain. Swish and spit Patient not taking: Reported on 08/12/2016 12/19/14   Trixie Dredge, PA-C  metroNIDAZOLE (FLAGYL) 500 MG tablet Take 1 tablet (500 mg total) by mouth 2 (two) times daily. Patient not taking: Reported on 08/12/2016 11/02/15   Everlene Farrier, PA-C  ondansetron (ZOFRAN ODT) 4 MG  disintegrating tablet Take 1 tablet (4 mg total) by mouth every 8 (eight) hours as needed for nausea or vomiting. 08/12/16   Earley Favor, NP  oxyCODONE-acetaminophen (PERCOCET/ROXICET) 5-325 MG tablet Take 1-2 tablets by mouth every 4 (four) hours as needed for moderate pain or severe pain. Patient not taking: Reported on 08/12/2016 12/19/14   Trixie Dredge, PA-C  polyethylene glycol [facility] Ethelene Hal) packet Take 17 g by mouth 2 (two) times daily as needed for mild constipation or moderate constipation. Patient not taking: Reported on 08/12/2016 12/19/14   Trixie Dredge, PA-C  predniSONE (DELTASONE) 20 MG tablet Take 40mg  for 3days then 20mg  for 3days then 10mg  for 3days then STOP Patient not taking: Reported on 08/12/2016 12/16/14   Zannie Cove, MD  promethazine (PHENERGAN) 25 MG tablet Take 1 tablet (25 mg total) by mouth every 6 (six) hours as needed for nausea or vomiting. Patient not taking: Reported on 08/12/2016 12/11/14   Arby Barrette, MD    Family History No family history on file.  Social History Social History  Substance Use Topics  . Smoking status: Current Every Day Smoker  . Smokeless tobacco: Never Used  . Alcohol use Yes     Allergies   Patient has no known allergies.   Review of Systems Review of Systems  Constitutional: Negative for fever.  Gastrointestinal: Positive for abdominal pain, diarrhea, nausea and vomiting.  Genitourinary: Positive for dysuria. Negative for discharge, frequency and testicular pain.  All other systems reviewed and are negative.    Physical Exam Updated Vital Signs BP (!) 122/98   Pulse 81   Temp 98.7 F (37.1 C) (Oral)   Resp 16   Ht 5\' 9"  (1.753 m)   Wt 101.2 kg (223 lb)   SpO2 100%   BMI 32.93 kg/m   Physical Exam  Constitutional: He appears well-developed and well-nourished.  HENT:  Head: Normocephalic.  Mouth/Throat: Oropharynx is clear and moist.  Eyes: Pupils are equal, round, and reactive to light.  Neck: Normal  range of motion.  Cardiovascular: Normal rate and regular rhythm.   Pulmonary/Chest: Effort normal and breath sounds normal.  Abdominal: Soft. He exhibits no distension. Bowel sounds are increased. There is generalized tenderness. There is no guarding.  Musculoskeletal: Normal range of motion.  Neurological: He is alert.  Skin: Skin is warm.  Nursing note and vitals reviewed.    ED Treatments / Results  Labs (all labs ordered are listed, but only abnormal results are displayed) Labs Reviewed  COMPREHENSIVE METABOLIC PANEL - Abnormal; Notable for the following:       Result Value   Glucose, Bld 113 (*)    All other components within normal limits  CBC - Abnormal; Notable for the following:    Platelets 416 (*)    All other components within normal limits  URINALYSIS, ROUTINE W REFLEX MICROSCOPIC - Abnormal; Notable for the following:    Protein, ur 30 (*)    Bacteria, UA RARE (*)    Squamous Epithelial / LPF 0-5 (*)    All other components within normal limits  LIPASE, BLOOD    EKG  EKG Interpretation None       Radiology Ct Abdomen Pelvis W Contrast  Result Date: 08/12/2016 CLINICAL DATA:  Diarrhea. Nausea and vomiting. Generalized abdominal pain. Symptoms for 3 days. History of Behcet disease. EXAM: CT ABDOMEN AND PELVIS WITH CONTRAST TECHNIQUE: Multidetector CT imaging of the abdomen and pelvis was performed using the standard protocol following bolus administration of intravenous contrast. CONTRAST:  ISOVUE-300 IOPAMIDOL (ISOVUE-300) INJECTION 61% COMPARISON:  None. FINDINGS: Lower chest: The lung bases are clear. Hepatobiliary: Diffusely decreased hepatic density suggesting steatosis. 7 mm hypodense lesion in the right hepatic lobe is too small to accurately characterize, likely small cyst. Gallbladder physiologically distended, no calcified stone. No biliary dilatation. Pancreas: No ductal dilatation or inflammation. Spleen: Normal in size without focal abnormality.  Small splenule inferiorly. Adrenals/Urinary Tract: Normal adrenal glands. No hydronephrosis or perinephric edema. Homogeneous renal enhancement. No focal lesion. Ureters are decompressed. Urinary bladder is minimally distended. Stomach/Bowel: Stomach is within normal limits. Appendix appears normal. No evidence of bowel wall thickening, distention, or inflammatory changes. Vascular/Lymphatic: No significant vascular findings are present. Scattered prominent mesenteric nodes largest measuring 8 mm, likely reactive. No retroperitoneal adenopathy. No enlarged pelvic lymph nodes. Reproductive: Prostate is unremarkable. Other: No free air, free fluid, or intra-abdominal fluid collection. Small fat containing umbilical hernia. Musculoskeletal: There are no acute or suspicious osseous abnormalities. IMPRESSION: 1. No acute abnormality in the abdomen/pelvis. No bowel inflammation or explanation for diarrhea. 2. Hepatic steatosis. Electronically Signed   By: Rubye Oaks M.D.   On: 08/12/2016 20:54    Procedures Procedures (including critical care time)  Medications Ordered in ED Medications  sodium chloride 0.9 % bolus 1,000 mL (0 mLs Intravenous Stopped 08/12/16 2121)  iopamidol (ISOVUE-300) 61 % injection (100 mLs  Contrast Given 08/12/16  2026)  ketorolac (TORADOL) 30 MG/ML injection 30 mg (30 mg Intravenous Given 08/12/16 2121)     Initial Impression / Assessment and Plan / ED Course  I have reviewed the triage vital signs and the nursing notes.  Pertinent labs & imaging results that were available during my care of the patient were reviewed by me and considered in my medical decision making (see chart for details).      Labs reviewed within normal parameters.  CT scan shows that he has had hepatic stenosis and possibly a small hepatic cyst.  Remainder of his abdominal CT scan are within normal limits.  No sign of diverticular disease, abscess, bowel inflammation, took sling.  Patient's diarrhea.  On  reexamination, patient states he still has some discomfort and abdomen.  He's had no further episodes of vomiting or diarrhea  Final Clinical Impressions(s) / ED Diagnoses   Final diagnoses:  Nausea vomiting and diarrhea    New Prescriptions New Prescriptions   ONDANSETRON (ZOFRAN ODT) 4 MG DISINTEGRATING TABLET    Take 1 tablet (4 mg total) by mouth every 8 (eight) hours as needed for nausea or vomiting.     Earley FavorSchulz, Eevie Lapp, NP 08/12/16 2209    Alvira MondaySchlossman, Erin, MD 08/15/16 [id number]

## 2016-08-12 NOTE — ED Notes (Signed)
Pt presents reporting gen abd pain with N/V/D X3 days. Pt states pain is sharp and cramping. Tender with palpation. When this nurse was leaving room pt asked for food. Instructed pt to wait until seen by EDP.

## 2016-12-13 IMAGING — CR DG CHEST 2V
2 series · 2 of 2 positions shown · non-contrast
Comparison: None.

CLINICAL DATA: Cough, congestion, shortness of breath for 6 days,
smoker

EXAM:
CHEST  2 VIEW

[w chest pa]
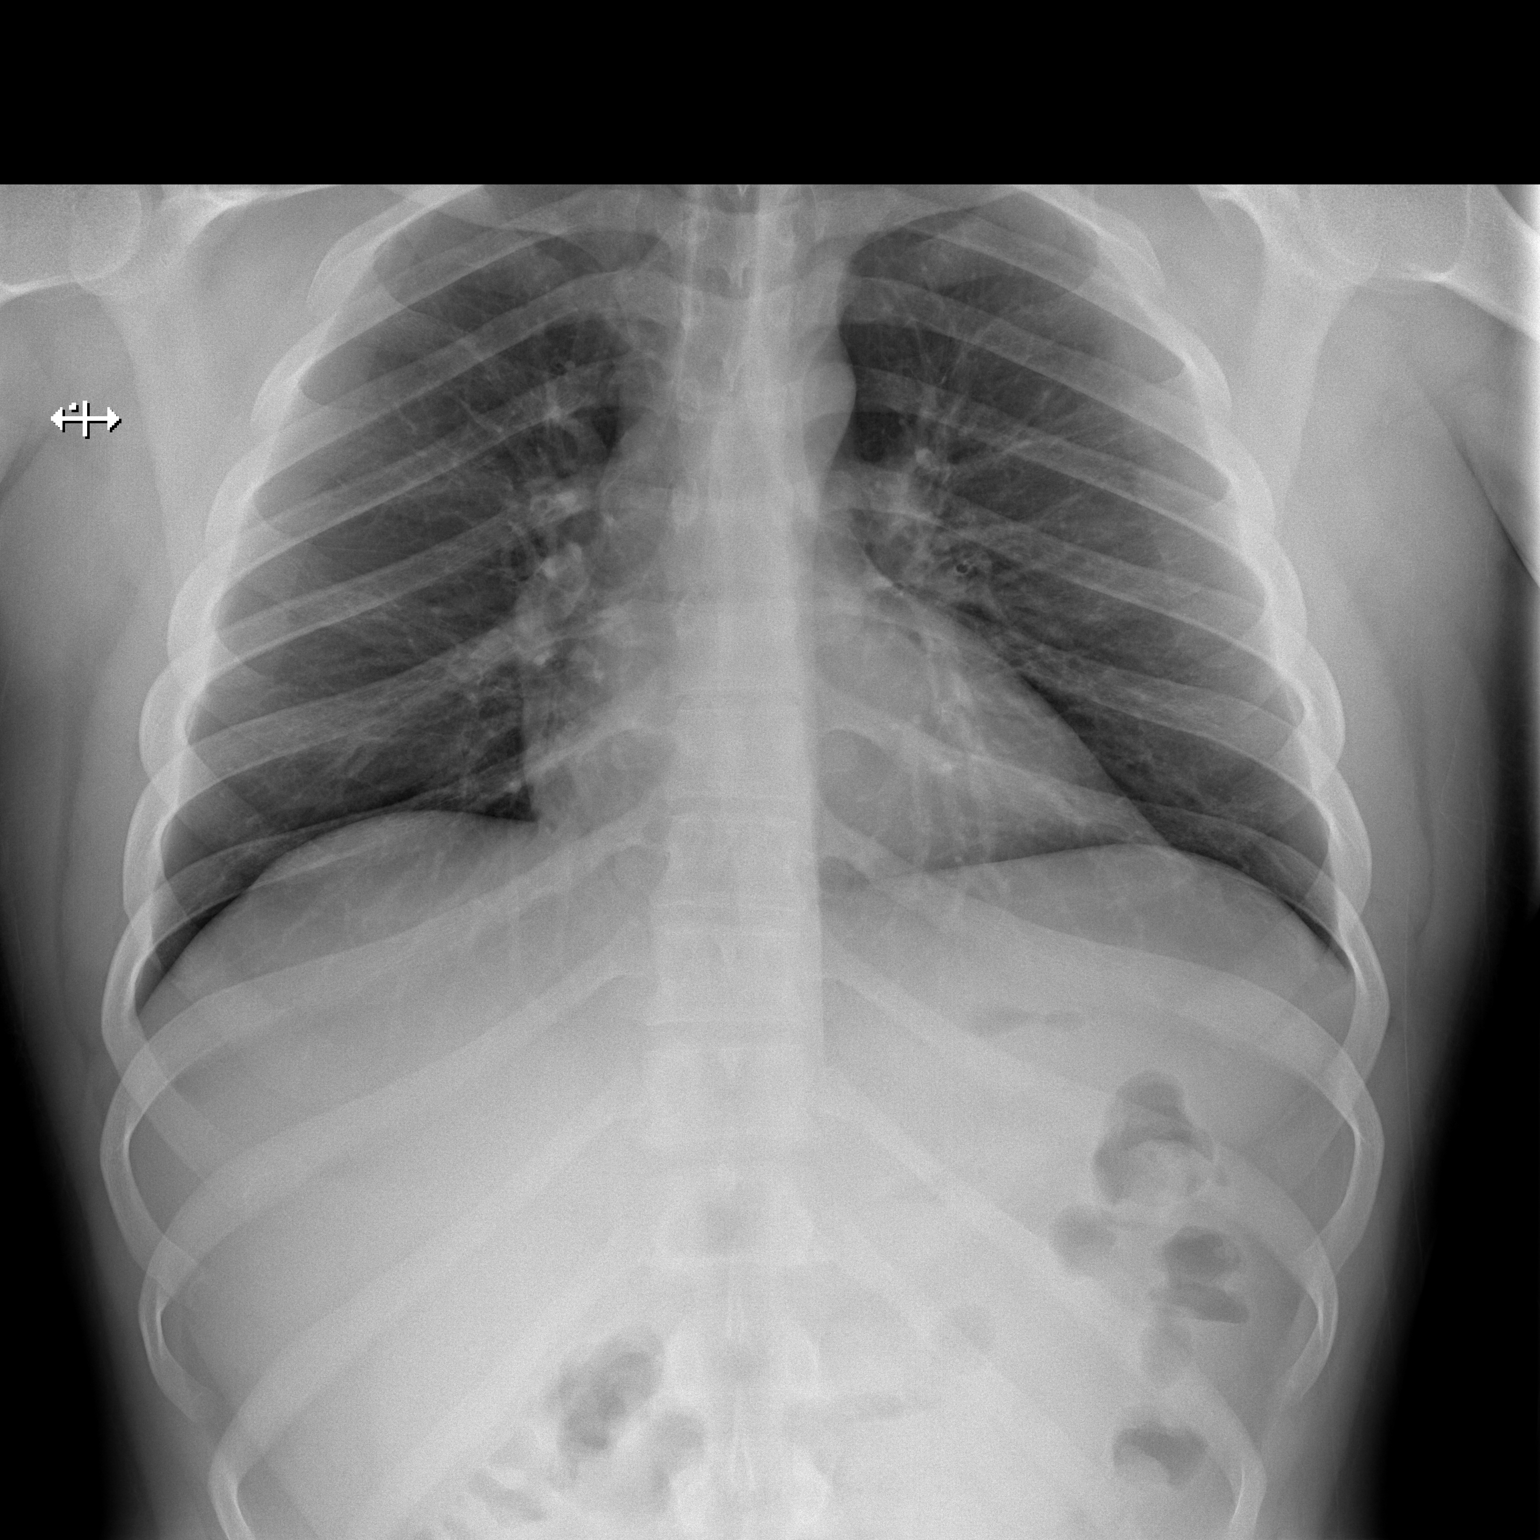

[w chest lat]
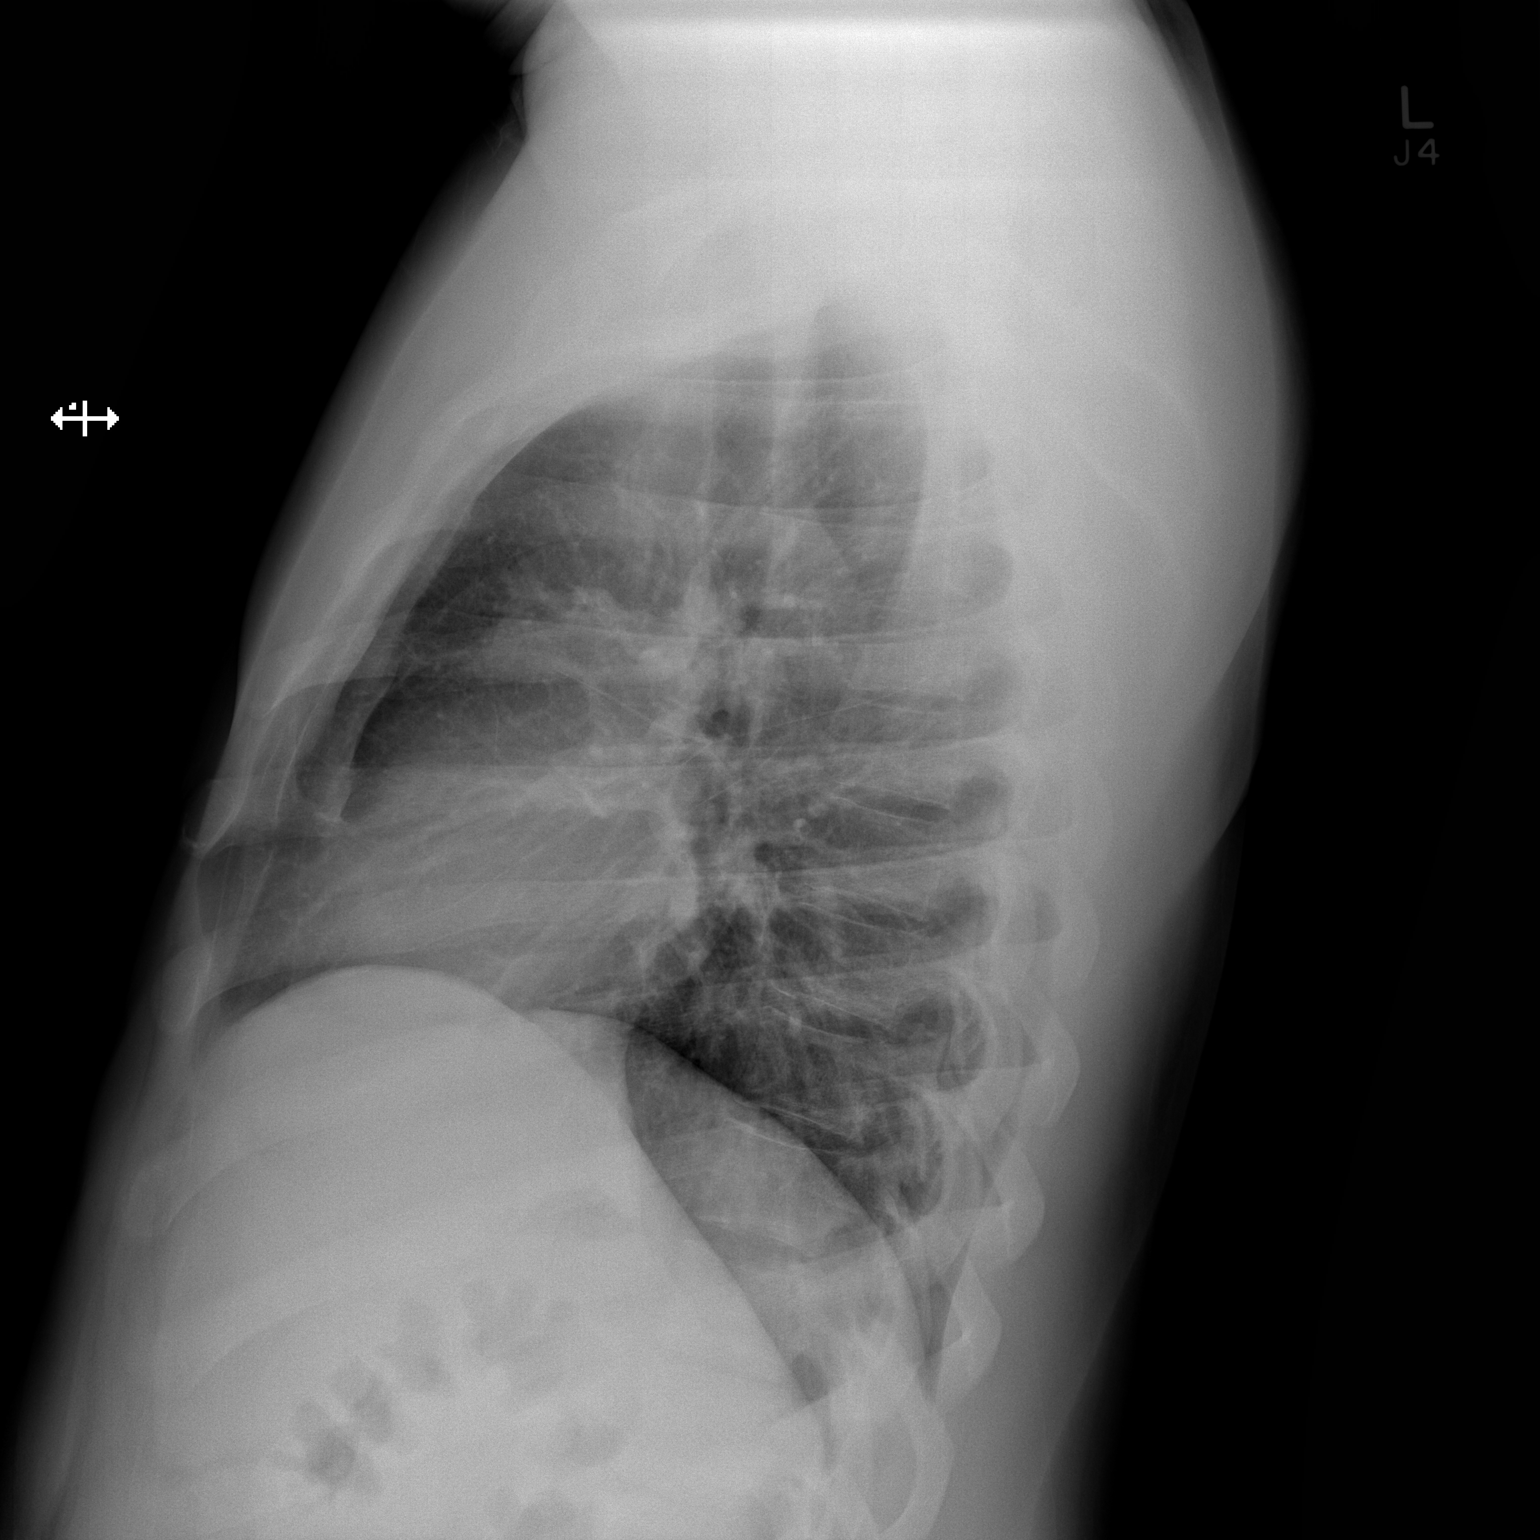

[2 of 2 positions shown; findings below may reference images not displayed]

FINDINGS: Cardiomediastinal silhouette is unremarkable. No acute infiltrate or
pleural effusion. No pulmonary edema. Bony thorax is unremarkable.
IMPRESSION: No active cardiopulmonary disease.

## 2016-12-21 IMAGING — CR DG CHEST 2V
2 series · 2 of 2 positions shown · non-contrast
Comparison: 12/12/2014; 12/11/2014

CLINICAL DATA: Generalized pain.  Coughing up blood.

EXAM:
CHEST  2 VIEW

[w chest pa]
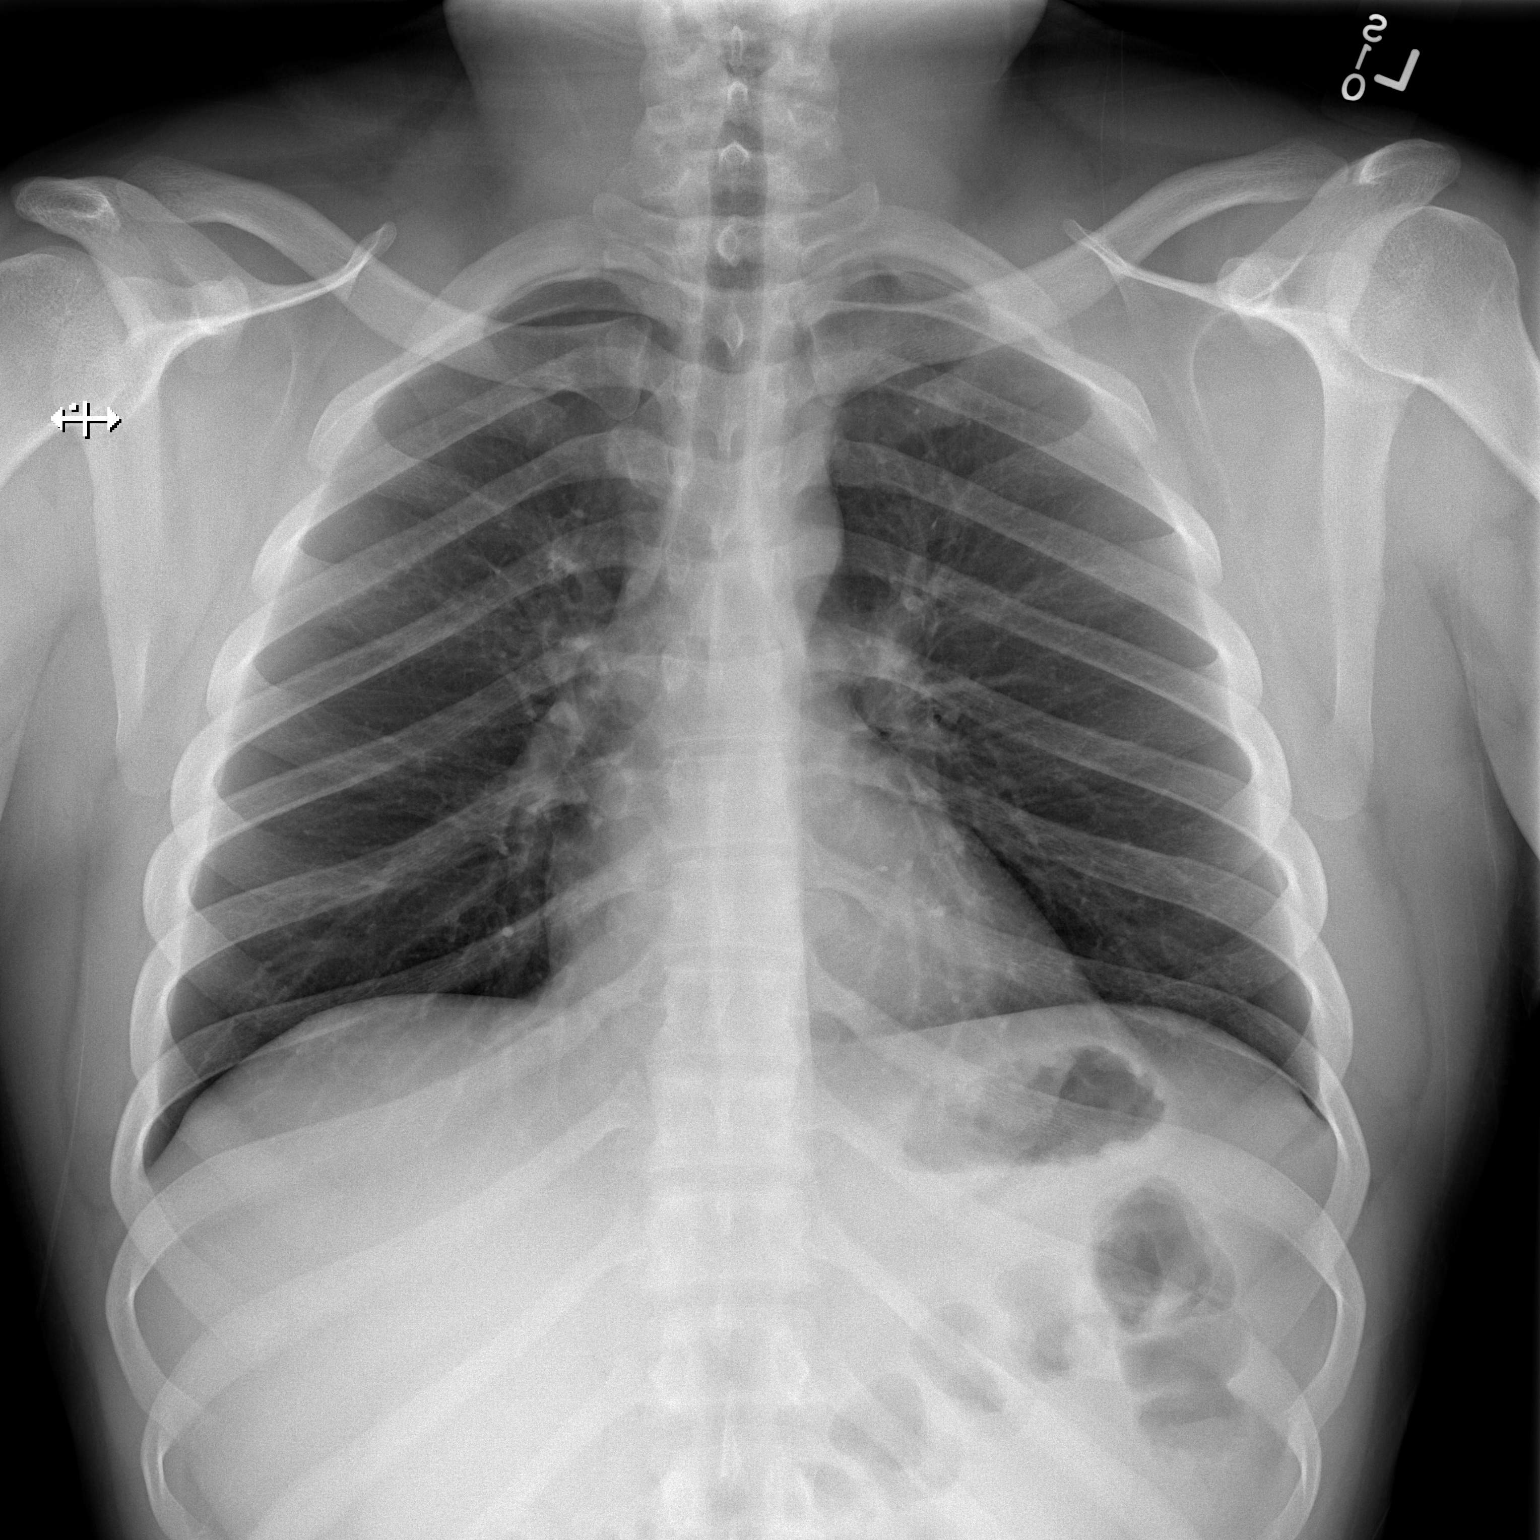

[w chest lat]
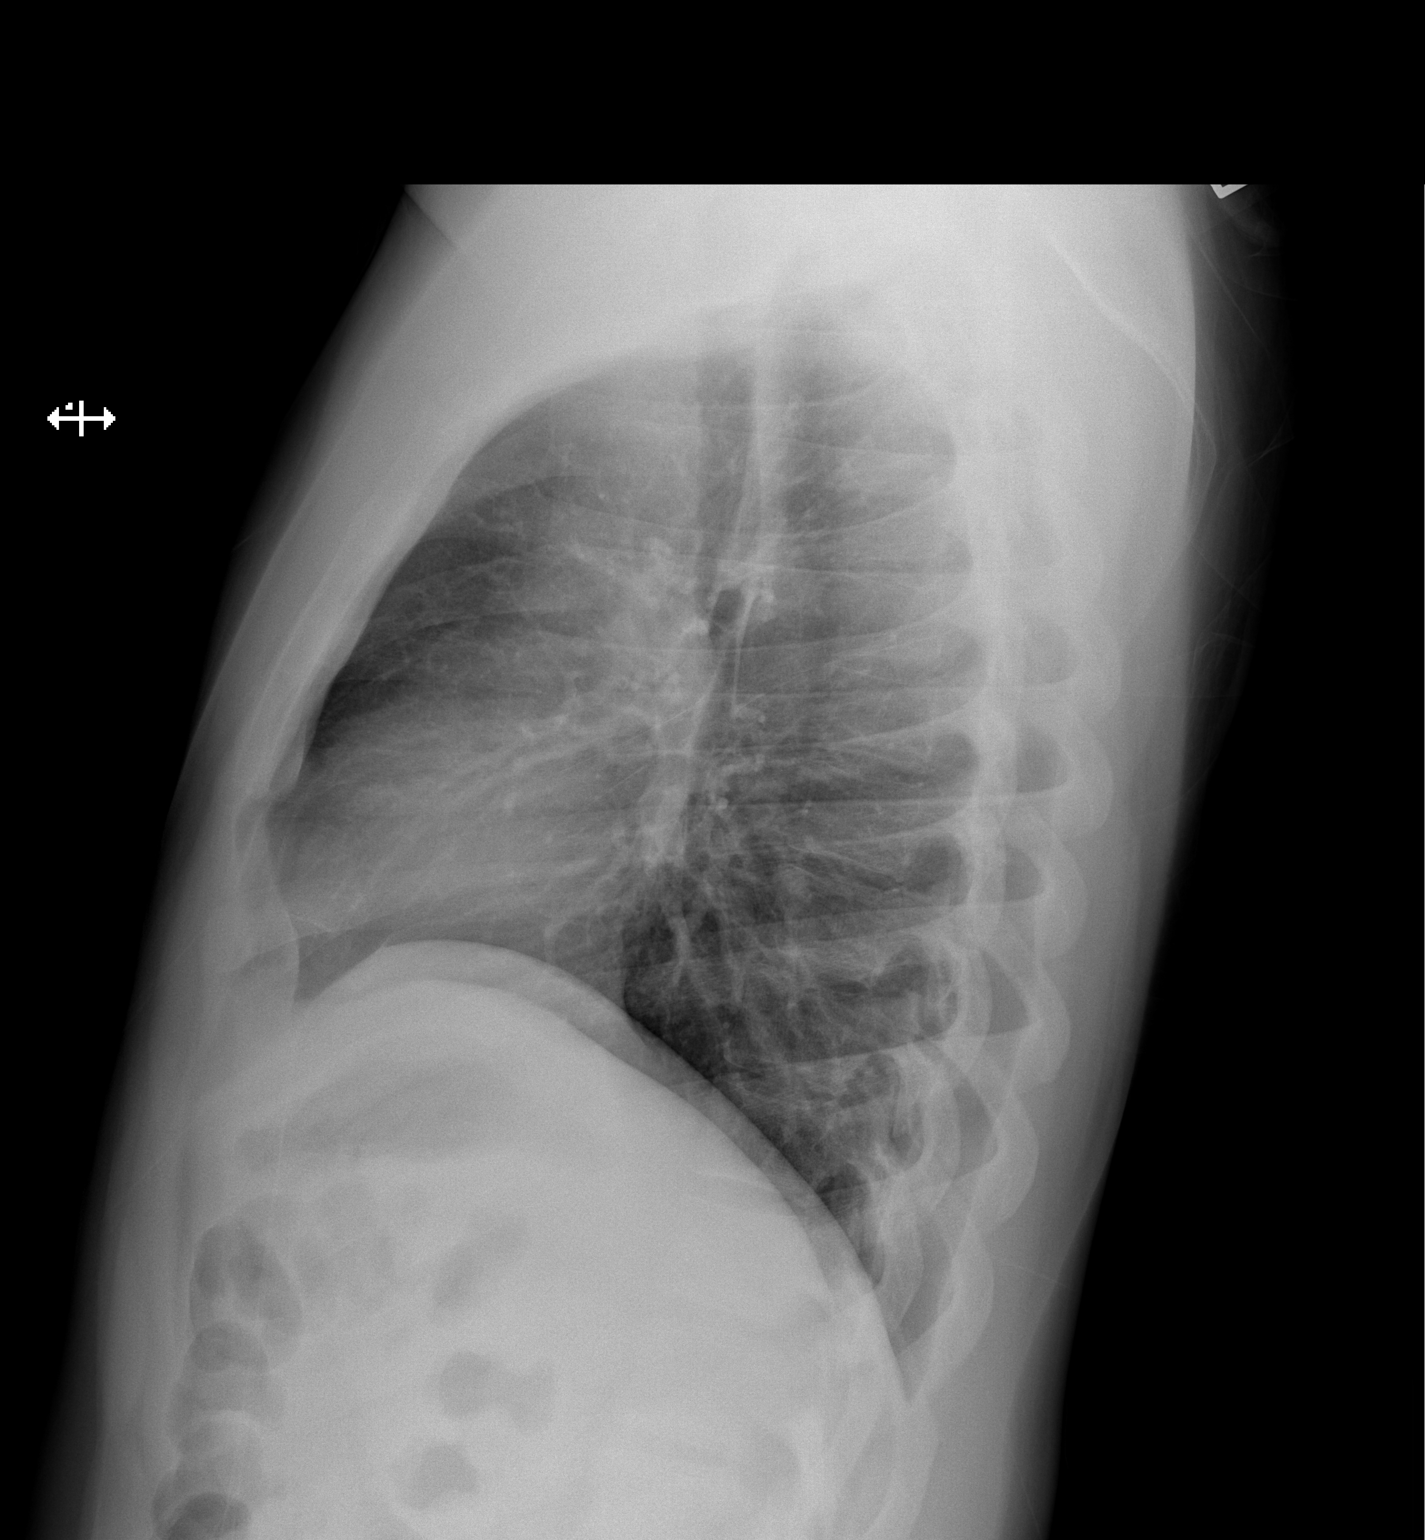

[2 of 2 positions shown; findings below may reference images not displayed]

FINDINGS: Grossly unchanged cardiac silhouette and mediastinal contours. No
focal parenchymal opacities. No pleural effusion or pneumothorax. No
evidence of edema. No acute osseus abnormalities.
IMPRESSION: No acute cardiopulmonary disease. Specifically, no evidence of
pneumonia.

## 2017-01-05 ENCOUNTER — Emergency Department (HOSPITAL_COMMUNITY)
Admission: EM | Admit: 2017-01-05 | Discharge: 2017-01-06 | Disposition: A | Discharge status: Home / Self Care | Payer: Medicaid Other | Attending: Emergency Medicine | Admitting: Emergency Medicine

## 2017-01-05 ENCOUNTER — Encounter (HOSPITAL_COMMUNITY): Payer: Self-pay | Admitting: Emergency Medicine

## 2017-01-05 DIAGNOSIS — Z202 Contact with and (suspected) exposure to infections with a predominantly sexual mode of transmission: Secondary | ICD-10-CM | POA: Insufficient documentation

## 2017-01-05 DIAGNOSIS — R1030 Lower abdominal pain, unspecified: Secondary | ICD-10-CM | POA: Insufficient documentation

## 2017-01-05 DIAGNOSIS — R369 Urethral discharge, unspecified: Secondary | ICD-10-CM | POA: Diagnosis present

## 2017-01-05 DIAGNOSIS — F17200 Nicotine dependence, unspecified, uncomplicated: Secondary | ICD-10-CM | POA: Diagnosis not present

## 2017-01-05 LAB — COMPREHENSIVE METABOLIC PANEL
ALK PHOS: 52 U/L (ref 38–126)
ALT: 90 U/L — AB (ref 17–63)
AST: 130 U/L — AB (ref 15–41)
Albumin: 4.2 g/dL (ref 3.5–5.0)
Anion gap: 11 (ref 5–15)
BUN: 7 mg/dL (ref 6–20)
CALCIUM: 9.2 mg/dL (ref 8.9–10.3)
CHLORIDE: 105 mmol/L (ref 101–111)
CO2: 21 mmol/L — AB (ref 22–32)
CREATININE: 1.22 mg/dL (ref 0.61–1.24)
GFR calc non Af Amer: >60 mL/min (ref >60)
Glucose, Bld: 117 mg/dL — ABNORMAL HIGH (ref 65–99)
Potassium: 4 mmol/L (ref 3.5–5.1)
SODIUM: 137 mmol/L (ref 135–145)
Total Bilirubin: 0.4 mg/dL (ref 0.3–1.2)
Total Protein: 7.3 g/dL (ref 6.5–8.1)

## 2017-01-05 LAB — CBC
HCT: 41.2 % (ref 39.0–52.0)
Hemoglobin: 14 g/dL (ref 13.0–17.0)
MCH: 29.7 pg (ref 26.0–34.0)
MCHC: 34 g/dL (ref 30.0–36.0)
MCV: 87.5 fL (ref 78.0–100.0)
PLATELETS: 434 10*3/uL — AB (ref 150–400)
RBC: 4.71 MIL/uL (ref 4.22–5.81)
RDW: 14.2 % (ref 11.5–15.5)
WBC: 7.6 10*3/uL (ref 4.0–10.5)

## 2017-01-05 LAB — URINALYSIS, ROUTINE W REFLEX MICROSCOPIC
Bilirubin Urine: NEGATIVE
GLUCOSE, UA: NEGATIVE mg/dL
Hgb urine dipstick: NEGATIVE
Ketones, ur: NEGATIVE mg/dL
Nitrite: NEGATIVE
PH: 5 (ref 5.0–8.0)
Protein, ur: NEGATIVE mg/dL
SPECIFIC GRAVITY, URINE: 1.02 (ref 1.005–1.030)

## 2017-01-05 LAB — LIPASE, BLOOD: Lipase: 36 U/L (ref 11–51)

## 2017-01-05 NOTE — ED Provider Notes (Signed)
[facility] Provider Note   CSN: [id number] Arrival date & time: 01/05/17  1725     History   Chief Complaint Chief Complaint  Patient presents with  . Penile Discharge  . Abdominal Pain    HPI Julian Reynolds is a [age] y.o. male.  HPI  26 y.o. male, presents to the Emergency Department today due to penile discharge. Noticed today. No penile lesions. Notes mild abdominal discomfort that is lower suprapubic area. Rates 4/10. Notes dysuria. No fevers. No N/V/D. No CP/SOB. Pt sexually active with 2 partners. No meds PTA. No other symptoms noted    History reviewed. No pertinent past medical history.  Patient Active Problem List   Diagnosis Date Noted  . Other lesions of oral mucosa   . Behcet's disease (HCC)   . Bilateral conjunctivitis   . Oral ulcer   . Penile lesion   . High risk sexual behavior   . Tobacco abuse disorder 12/12/2014  . AKI (acute kidney injury) (HCC) 12/12/2014  . Abnormal LFTs 12/12/2014    History reviewed. No pertinent surgical history.     Home Medications    Prior to Admission medications   Medication Sig Start Date End Date Taking? Authorizing Provider  albuterol (PROVENTIL HFA;VENTOLIN HFA) 108 (90 BASE) MCG/ACT inhaler Inhale 1 puff into the lungs every 6 (six) hours as needed for wheezing or shortness of breath.    [provider]  HYDROcodone-acetaminophen (NORCO/VICODIN) 5-325 MG tablet Take 1 tablet by mouth every 6 (six) hours as needed for severe pain. Patient not taking: Reported on 08/12/2016 12/16/14   Zannie CoveJoseph, Preetha, MD  ibuprofen (ADVIL,MOTRIN) 800 MG tablet Take 1 tablet (800 mg total) by mouth every 6 (six) hours as needed. Patient not taking: Reported on 08/12/2016 12/16/14   Zannie CoveJoseph, Preetha, MD  lidocaine (XYLOCAINE) 2 % solution Use as directed 15 mLs in the mouth or throat every 4 (four) hours as needed for mouth pain. Swish and spit Patient not taking: Reported on 08/12/2016 12/19/14    Trixie DredgeWest, Emily, PA-C  metroNIDAZOLE (FLAGYL) 500 MG tablet Take 1 tablet (500 mg total) by mouth 2 (two) times daily. Patient not taking: Reported on 08/12/2016 11/02/15   Everlene Farrieransie, William, PA-C  ondansetron (ZOFRAN ODT) 4 MG disintegrating tablet Take 1 tablet (4 mg total) by mouth every 8 (eight) hours as needed for nausea or vomiting. 08/12/16   Earley FavorSchulz, Gail, NP  oxyCODONE-acetaminophen (PERCOCET/ROXICET) 5-325 MG tablet Take 1-2 tablets by mouth every 4 (four) hours as needed for moderate pain or severe pain. Patient not taking: Reported on 08/12/2016 12/19/14   Trixie DredgeWest, Emily, PA-C  polyethylene glycol [facility](MIRALAX / Ethelene HalGLYCOLAX) packet Take 17 g by mouth 2 (two) times daily as needed for mild constipation or moderate constipation. Patient not taking: Reported on 08/12/2016 12/19/14   Trixie DredgeWest, Emily, PA-C  predniSONE (DELTASONE) 20 MG tablet Take 40mg  for 3days then 20mg  for 3days then 10mg  for 3days then STOP Patient not taking: Reported on 08/12/2016 12/16/14   Zannie CoveJoseph, Preetha, MD  promethazine (PHENERGAN) 25 MG tablet Take 1 tablet (25 mg total) by mouth every 6 (six) hours as needed for nausea or vomiting. Patient not taking: Reported on 08/12/2016 12/11/14   Arby BarrettePfeiffer, Marcy, MD    Family History History reviewed. No pertinent family history.  Social History Social History   Tobacco Use  . Smoking status: Current Every Day Smoker  . Smokeless tobacco: Never Used  Substance Use Topics  . Alcohol use: Yes  . Drug  use: Yes    Types: Marijuana     Allergies   Patient has no known allergies.   Review of Systems Review of Systems ROS reviewed and all are negative for acute change except as noted in the HPI.  Physical Exam Updated Vital Signs BP 122/84   Pulse 95   Temp 97.9 F (36.6 C) (Oral)   Resp 16   SpO2 97%   Physical Exam  Constitutional: He is oriented to person, place, and time. Vital signs are normal. He appears well-developed and well-nourished.  HENT:  Head: Normocephalic.  Right  Ear: Hearing normal.  Left Ear: Hearing normal.  Eyes: Conjunctivae and EOM are normal. Pupils are equal, round, and reactive to light.  Cardiovascular: Normal rate and regular rhythm.  Pulmonary/Chest: Effort normal.  Genitourinary:  Genitourinary Comments: Chaperone present. Penile discharge noted. No erythema. No lesions. Testicles non TTP. No unilateral lymphadenopathy   Neurological: He is alert and oriented to person, place, and time.  Skin: Skin is warm and dry.  Psychiatric: He has a normal mood and affect. His speech is normal and behavior is normal. Thought content normal.  Nursing note and vitals reviewed.    ED Treatments / Results  Labs (all labs ordered are listed, but only abnormal results are displayed) Labs Reviewed  COMPREHENSIVE METABOLIC PANEL - Abnormal; Notable for the following components:      Result Value   CO2 21 (*)    Glucose, Bld 117 (*)    AST 130 (*)    ALT 90 (*)    All other components within normal limits  CBC - Abnormal; Notable for the following components:   Platelets 434 (*)    All other components within normal limits  URINALYSIS, ROUTINE W REFLEX MICROSCOPIC - Abnormal; Notable for the following components:   APPearance HAZY (*)    Leukocytes, UA LARGE (*)    Bacteria, UA RARE (*)    Squamous Epithelial / LPF 0-5 (*)    All other components within normal limits  LIPASE, BLOOD  GC/CHLAMYDIA PROBE AMP ([facility]) NOT AT [facility]    EKG  EKG Interpretation None       Radiology No results found.  Procedures Procedures (including critical care time)  Medications Ordered in ED Medications - No data to display   Initial Impression / Assessment and Plan / ED Course  I have reviewed the triage vital signs and the nursing notes.  Pertinent labs & imaging results that were available during my care of the patient were reviewed by me and considered in my medical decision making (see chart for details).  Final Clinical Impressions(s)  / ED Diagnoses  {I have reviewed and evaluated the relevant laboratory values.   {I have reviewed the relevant previous healthcare records.  {I obtained HPI from historian.   ED Course:  Assessment: Pt is a 26 y.o. male presents to the Emergency Department today due to penile discharge. Noticed today. No penile lesions. Notes mild abdominal discomfort that is lower suprapubic area. Rates 4/10. Notes dysuria. No fevers. No N/V/D. No CP/SOB. Pt sexually active with 2 partners. No meds PTA. On exam, pt in NAD. Nontoxic/nonseptic appearing. VSS. Afebrile. Lungs CTA. Heart RRR. Abdomen nontender soft. GU exam  Penile discharge noted. No erythema. No lesions. Testicles non TTP. No unilateral lymphadenopathy. Labs reassuring.  GC obtained. Treated with Rocephin and Azithro. Safe sex practices discussed.  Plan is to DC home with follow up to PCP. At time of discharge, Patient is  in no acute distress. Vital Signs are stable. Patient is able to ambulate. Patient able to tolerate PO.   Disposition/Plan:  Dc Home Additional Verbal discharge instructions given and discussed with patient.  Pt Instructed to f/u with PCP in the next week for evaluation and treatment of symptoms. Return precautions given Pt acknowledges and agrees with plan  Supervising Physician Rancour, Jeannett Senior, MD  Final diagnoses:  STD exposure    ED Discharge Orders    None       Audry Pili, PA-C 01/06/17 0000    Benjiman Core, MD 01/07/17 [phone]

## 2017-01-05 NOTE — ED Triage Notes (Signed)
Pt to ER endorses green penile discharge with abdominal pain and states urinary frequency. Reports nausea and vomiting going on for one week. HR 115 in triage, all other vital signs stable. Pt poor historian.

## 2017-01-06 LAB — GC/CHLAMYDIA PROBE AMP (~~LOC~~) NOT AT ARMC
Chlamydia: NEGATIVE
Neisseria Gonorrhea: POSITIVE — AB

## 2017-01-06 MED ORDER — AZITHROMYCIN 250 MG PO TABS
1000.0000 mg | ORAL_TABLET | Freq: Once | ORAL | Status: AC
  Administered 2017-01-06: 1000 mg via ORAL
  Filled 2017-01-06: qty 4

## 2017-01-06 MED ORDER — CEFTRIAXONE SODIUM 250 MG IJ SOLR
250.0000 mg | Freq: Once | INTRAMUSCULAR | Status: AC
  Administered 2017-01-06: 250 mg via INTRAMUSCULAR
  Filled 2017-01-06: qty 250

## 2017-01-06 MED ORDER — LIDOCAINE HCL (PF) 1 % IJ SOLN
INTRAMUSCULAR | Status: AC
  Filled 2017-01-06: qty 5

## 2017-01-06 NOTE — Discharge Instructions (Signed)
Please read and follow all provided instructions.  Your diagnoses today include:  1. STD exposure     Tests performed today include: Test for gonorrhea and chlamydia. You will be notified by telephone if you have a positive result. Vital signs. See below for your results today.   Medications:  You were treated for chlamydia (1 gram azithromycin pills) and gonorrhea (250mg  rocephin shot).  Home care instructions:  Read educational materials contained in this packet and follow any instructions provided.   You should tell your partners about your infection and avoid having sex for one week to allow time for the medicine to work.  Follow-up instructions: You should [facility] to be tested for HIV, syphilis, and hepatitis -- all of which can be transmitted by sexual contact. We do not routinely screen for these in the Emergency Department.  STD Testing: [facility] [place] [place] [facility] [address], [place], phone [phone] or [phone]   Monday - Friday, call for an appointment [facility] [place] [place] [facility] [address], [place], phone [phone] or [phone]  Monday - Friday, call for an appointment  Return instructions:  Please return to the Emergency Department if you experience worsening symptoms.  Please return if you have any other emergent concerns.  Additional Information:  Your vital signs today were: BP 122/84    Pulse 95    Temp 97.9 F (36.6 C) (Oral)    Resp 16    SpO2 97%  If your blood pressure (BP) was elevated above 135/85 this visit, please have this repeated by your doctor within one month. --------------

## 2017-01-06 NOTE — ED Notes (Signed)
PT states understanding of care given, follow up care. PT ambulated from ED to car with a steady gait.  

## 2017-03-11 ENCOUNTER — Emergency Department (HOSPITAL_COMMUNITY)
Admission: EM | Admit: 2017-03-11 | Discharge: 2017-03-11 | Disposition: A | Discharge status: Home / Self Care | Payer: Medicaid Other | Attending: Emergency Medicine | Admitting: Emergency Medicine

## 2017-03-11 ENCOUNTER — Encounter (HOSPITAL_COMMUNITY): Payer: Self-pay | Admitting: *Deleted

## 2017-03-11 ENCOUNTER — Other Ambulatory Visit: Payer: Self-pay

## 2017-03-11 DIAGNOSIS — N3000 Acute cystitis without hematuria: Secondary | ICD-10-CM | POA: Insufficient documentation

## 2017-03-11 DIAGNOSIS — R369 Urethral discharge, unspecified: Secondary | ICD-10-CM | POA: Diagnosis present

## 2017-03-11 DIAGNOSIS — Z711 Person with feared health complaint in whom no diagnosis is made: Secondary | ICD-10-CM

## 2017-03-11 DIAGNOSIS — F17210 Nicotine dependence, cigarettes, uncomplicated: Secondary | ICD-10-CM | POA: Insufficient documentation

## 2017-03-11 LAB — URINALYSIS, ROUTINE W REFLEX MICROSCOPIC
Bilirubin Urine: NEGATIVE
GLUCOSE, UA: NEGATIVE mg/dL
Hgb urine dipstick: NEGATIVE
KETONES UR: NEGATIVE mg/dL
Nitrite: NEGATIVE
PH: 6 (ref 5.0–8.0)
Protein, ur: NEGATIVE mg/dL
SPECIFIC GRAVITY, URINE: 1.012 (ref 1.005–1.030)

## 2017-03-11 MED ORDER — AZITHROMYCIN 250 MG PO TABS
1000.0000 mg | ORAL_TABLET | Freq: Once | ORAL | Status: AC
Start: 2017-03-11 — End: 2017-03-11
  Administered 2017-03-11: 1000 mg via ORAL
  Filled 2017-03-11: qty 4

## 2017-03-11 MED ORDER — CEPHALEXIN 500 MG PO CAPS: 500.0000 mg | ORAL_CAPSULE | Freq: Four times a day (QID) | ORAL | 0 refills | Status: AC

## 2017-03-11 MED ORDER — DOXYCYCLINE HYCLATE 100 MG PO CAPS: 100.0000 mg | ORAL_CAPSULE | Freq: Two times a day (BID) | ORAL | 0 refills | Status: AC

## 2017-03-11 MED ORDER — LIDOCAINE HCL (PF) 1 % IJ SOLN
INTRAMUSCULAR | Status: AC
  Administered 2017-03-11: 2.1 mL
  Filled 2017-03-11: qty 5

## 2017-03-11 MED ORDER — CEFTRIAXONE SODIUM 1 G IJ SOLR
1.0000 g | Freq: Once | INTRAMUSCULAR | Status: AC
  Administered 2017-03-11: 1 g via INTRAMUSCULAR
  Filled 2017-03-11: qty 10

## 2017-03-11 NOTE — ED Provider Notes (Signed)
[facility] EMERGENCY DEPARTMENT Provider Note   CSN: [id number] Arrival date & time: 03/11/17  1754     History   Chief Complaint Chief Complaint  Patient presents with  . Penile Discharge    HPI Julian Reynolds is a 27 y.o. male w/ h/o previous STD here for evaluation of penile discharge and dysuria since this morning. Penile discharge is yellow/green, sticking to his underwear. Last had sexual intercourse 1.5 weeks ago without condom with new partner. He states he never wears a condom. Unsure if partner has symptoms. Associated with mild lower abdominal discomfort with urination and testicular pain bilaterally. Denies fevers, chills, nausea, vomiting, diarrhea, constipation, hematuria, flank pain, rash, new lesions, rectal discharge or pain. No alleviating or aggravating factors. No interventions PTA. Sexually active with male partners only.   HPI  History reviewed. No pertinent past medical history.  Patient Active Problem List   Diagnosis Date Noted  . Other lesions of oral mucosa   . Behcet's disease (HCC)   . Bilateral conjunctivitis   . Oral ulcer   . Penile lesion   . High risk sexual behavior   . Tobacco abuse disorder 12/12/2014  . AKI (acute kidney injury) (HCC) 12/12/2014  . Abnormal LFTs 12/12/2014    History reviewed. No pertinent surgical history.     Home Medications    Prior to Admission medications   Medication Sig Start Date End Date Taking? Authorizing Provider  albuterol (PROVENTIL HFA;VENTOLIN HFA) 108 (90 BASE) MCG/ACT inhaler Inhale 1 puff into the lungs every 6 (six) hours as needed for wheezing or shortness of breath.    [provider]  cephALEXin (KEFLEX) 500 MG capsule Take 1 capsule (500 mg total) by mouth 4 (four) times daily. 03/11/17   Liberty Handy, PA-C  doxycycline (VIBRAMYCIN) 100 MG capsule Take 1 capsule (100 mg total) by mouth 2 (two) times daily for 10 days. 03/11/17 03/21/17  Liberty Handy, PA-C    HYDROcodone-acetaminophen (NORCO/VICODIN) 5-325 MG tablet Take 1 tablet by mouth every 6 (six) hours as needed for severe pain. Patient not taking: Reported on 08/12/2016 12/16/14   Zannie Cove, MD  ibuprofen (ADVIL,MOTRIN) 800 MG tablet Take 1 tablet (800 mg total) by mouth every 6 (six) hours as needed. Patient not taking: Reported on 08/12/2016 12/16/14   Zannie Cove, MD  lidocaine (XYLOCAINE) 2 % solution Use as directed 15 mLs in the mouth or throat every 4 (four) hours as needed for mouth pain. Swish and spit Patient not taking: Reported on 08/12/2016 12/19/14   Trixie Dredge, PA-C  metroNIDAZOLE (FLAGYL) 500 MG tablet Take 1 tablet (500 mg total) by mouth 2 (two) times daily. Patient not taking: Reported on 08/12/2016 11/02/15   Everlene Farrier, PA-C  ondansetron (ZOFRAN ODT) 4 MG disintegrating tablet Take 1 tablet (4 mg total) by mouth every 8 (eight) hours as needed for nausea or vomiting. 08/12/16   Earley Favor, NP  oxyCODONE-acetaminophen (PERCOCET/ROXICET) 5-325 MG tablet Take 1-2 tablets by mouth every 4 (four) hours as needed for moderate pain or severe pain. Patient not taking: Reported on 08/12/2016 12/19/14   Trixie Dredge, PA-C  polyethylene glycol [facility] / Ethelene Hal) packet Take 17 g by mouth 2 (two) times daily as needed for mild constipation or moderate constipation. Patient not taking: Reported on 08/12/2016 12/19/14   Trixie Dredge, PA-C  predniSONE (DELTASONE) 20 MG tablet Take 40mg  for 3days then 20mg  for 3days then 10mg  for 3days then STOP Patient not taking: Reported on 08/12/2016  12/16/14   Zannie CoveJoseph, Preetha, MD  promethazine (PHENERGAN) 25 MG tablet Take 1 tablet (25 mg total) by mouth every 6 (six) hours as needed for nausea or vomiting. Patient not taking: Reported on 08/12/2016 12/11/14   Arby BarrettePfeiffer, Marcy, MD    Family History No family history on file.  Social History Social History   Tobacco Use  . Smoking status: Current Every Day Smoker    Types: Cigarettes  . Smokeless  tobacco: Never Used  Substance Use Topics  . Alcohol use: Yes    Alcohol/week: 24.0 oz    Types: 40 Shots of liquor per week  . Drug use: Yes    Types: Marijuana, Cocaine     Allergies   Patient has no known allergies.   Review of Systems Review of Systems  Gastrointestinal: Positive for abdominal pain.  Genitourinary: Positive for difficulty urinating, discharge, dysuria and testicular pain.  All other systems reviewed and are negative.    Physical Exam Updated Vital Signs BP (!) 144/88 (BP Location: Right Arm)   Pulse 100   Temp 98.7 F (37.1 C) (Oral)   Resp 18   Ht 5\' 11"  (1.803 m)   Wt 99.8 kg (220 lb)   SpO2 98%   BMI 30.68 kg/m   Physical Exam  Constitutional: He is oriented to person, place, and time. He appears well-developed and well-nourished. No distress.  NAD.  HENT:  Head: Normocephalic and atraumatic.  Right Ear: External ear normal.  Left Ear: External ear normal.  Nose: Nose normal.  Eyes: Conjunctivae and EOM are normal. No scleral icterus.  Neck: Normal range of motion. Neck supple.  Cardiovascular: Normal rate, regular rhythm, normal heart sounds and intact distal pulses.  No murmur heard. Pulmonary/Chest: Effort normal and breath sounds normal. He has no wheezes.  Abdominal: Soft. There is tenderness.  Suprapubic tenderness. No CVAT. No G/R/R.   Genitourinary: Cremasteric reflex is present. Discharge found.  Genitourinary Comments:  Penis and testicles without erythema, edema, tenderness or lesions.  Circumcised male.  No groin lymphadenopathy.  +White meatus discharge  Scrotum without lesions or edema.  Non tender testicles. Epididymis and spermatic cord without tenderness or masses, bilaterally. Cremasteric reflex intact.  Musculoskeletal: Normal range of motion. He exhibits no deformity.  Neurological: He is alert and oriented to person, place, and time.  Skin: Skin is warm and dry. Capillary refill takes less than 2 seconds.    Psychiatric: He has a normal mood and affect. His behavior is normal. Judgment and thought content normal.  Nursing note and vitals reviewed.    ED Treatments / Results  Labs (all labs ordered are listed, but only abnormal results are displayed) Labs Reviewed  URINALYSIS, ROUTINE W REFLEX MICROSCOPIC - Abnormal; Notable for the following components:      Result Value   APPearance HAZY (*)    Leukocytes, UA LARGE (*)    Bacteria, UA RARE (*)    Squamous Epithelial / LPF 0-5 (*)    All other components within normal limits  GC/CHLAMYDIA PROBE AMP ([facility]) NOT AT [facility]    EKG  EKG Interpretation None       Radiology No results found.  Procedures Procedures (including critical care time)  Medications Ordered in ED Medications  cefTRIAXone (ROCEPHIN) injection 1 g (1 g Intramuscular Given 03/11/17 2104)  azithromycin (ZITHROMAX) tablet 1,000 mg (1,000 mg Oral Given 03/11/17 2104)  lidocaine (PF) (XYLOCAINE) 1 % injection (2.1 mLs  Given 03/11/17 2104)     Initial Impression /  Assessment and Plan / ED Course  I have reviewed the triage vital signs and the nursing notes.  Pertinent labs & imaging results that were available during my care of the patient were reviewed by me and considered in my medical decision making (see chart for details).    UA shows infection. Given high risk sexual practices, pt was treated for gonorrhea and chlamydia in the ED. Will d/c with abx for UTI as well as doxycycline x 10 days to cover for epididymitis/orchitis/prostatitis/urethritis given testicular pain. Discussed risk of unprotected sex with patient, offered condoms but he declined. He is aware he needs to notify partners.   Final Clinical Impressions(s) / ED Diagnoses   Final diagnoses:  Penile discharge  Acute cystitis without hematuria  Concern about STD in male without diagnosis    ED Discharge Orders        Ordered    doxycycline (VIBRAMYCIN) 100 MG capsule  2 times daily      03/11/17 2133    cephALEXin (KEFLEX) 500 MG capsule  4 times daily     03/11/17 2133       Jerrell Mylar 03/11/17 2133    Rolan Bucco, MD 03/11/17 2200

## 2017-03-11 NOTE — ED Triage Notes (Signed)
Pt c/o penile discharge and dysuria onset today

## 2017-03-11 NOTE — Discharge Instructions (Signed)
You have a urinary tract infection. This can cause burning with urination. Given your lack of condom use, you were treated for gonorrhea and chlamydia in ED. Take both antibiotics as prescribed. You should wear condoms during sexual encounters to prevent sexual diseases including gonorrhea, chlamydia, hepatitis C, HIV. Notify all your partners of your symptoms so they can seek treatment. Do not have sexual encounters for the next 7 days.

## 2017-03-14 LAB — GC/CHLAMYDIA PROBE AMP (~~LOC~~) NOT AT ARMC
Chlamydia: NEGATIVE
Neisseria Gonorrhea: POSITIVE — AB
